# Patient Record
Sex: Female | Born: 1950
Health system: Southern US, Community
[De-identification: ages and names within clinical notes are randomized; demographics above are authoritative.]

## PROBLEM LIST (undated history)

## (undated) DIAGNOSIS — G43909 Migraine, unspecified, not intractable, without status migrainosus: Secondary | ICD-10-CM

## (undated) DIAGNOSIS — M23209 Derangement of unspecified meniscus due to old tear or injury, unspecified knee: Secondary | ICD-10-CM

## (undated) DIAGNOSIS — R51 Headache: Secondary | ICD-10-CM

## (undated) DIAGNOSIS — K219 Gastro-esophageal reflux disease without esophagitis: Secondary | ICD-10-CM

## (undated) DIAGNOSIS — J069 Acute upper respiratory infection, unspecified: Secondary | ICD-10-CM

## (undated) DIAGNOSIS — F4310 Post-traumatic stress disorder, unspecified: Secondary | ICD-10-CM

## (undated) HISTORY — DX: Post-traumatic stress disorder, unspecified: F43.10

---

## 2000-06-23 ENCOUNTER — Inpatient Hospital Stay (HOSPITAL_COMMUNITY): Admission: EM | Admit: 2000-06-23 | Discharge: 2000-06-26 | Payer: Self-pay | Admitting: Orthopedic Surgery

## 2000-06-24 ENCOUNTER — Encounter: Payer: Self-pay | Admitting: Orthopedic Surgery

## 2001-04-07 ENCOUNTER — Ambulatory Visit (HOSPITAL_BASED_OUTPATIENT_CLINIC_OR_DEPARTMENT_OTHER): Admission: RE | Admit: 2001-04-07 | Discharge: 2001-04-07 | Payer: Self-pay | Admitting: Orthopedic Surgery

## 2004-06-27 ENCOUNTER — Other Ambulatory Visit: Admission: RE | Admit: 2004-06-27 | Discharge: 2004-06-27 | Payer: Self-pay | Admitting: Obstetrics and Gynecology

## 2006-11-19 ENCOUNTER — Other Ambulatory Visit
Admission: RE | Admit: 2006-11-19 | Discharge: 2006-11-19 | Payer: Self-pay | Admitting: Physical Medicine & Rehabilitation

## 2006-11-24 ENCOUNTER — Encounter: Admission: RE | Admit: 2006-11-24 | Discharge: 2006-11-24 | Payer: Self-pay | Admitting: Obstetrics and Gynecology

## 2010-10-08 ENCOUNTER — Encounter: Admission: RE | Admit: 2010-10-08 | Discharge: 2010-10-08 | Payer: Self-pay | Admitting: Family Medicine

## 2011-05-08 NOTE — Op Note (Signed)
. [facility]  Patient:    Denise Patton, Denise Patton                          MRN: [id number] Proc. Date: 04/07/01 Adm. Date:  [id number] Attending:  Aldean Baker V                           Operative Report  PREOPERATIVE DIAGNOSIS:  Painful retained proximal interlocking screw, left tibial nail.  POSTOPERATIVE DIAGNOSIS:  Painful retained proximal interlocking screw, left tibial nail.  OPERATION PERFORMED:  Removal of deep retained hardware.  SURGEON:  Nadara Mustard, M.D.  ANESTHESIA:  LMA.  ESTIMATED BLOOD LOSS:  Minimal.  ANTIBIOTICS:  One gram of Kefzol.  TOURNIQUET TIME:  None.  PATHOLOGY:  Screw given to patient.  DISPOSITION:  To PACU in stable condition.  INDICATIONS FOR PROCEDURE:  The patient is a 60 year old woman who is status post a pilon fracture for which she underwent open reduction internal fixation and has healed her bony fracture and presents at this time for removal of proximal painful retained screw.  Risks and benefits were discussed including infection, neurovascular injury, persistent pain.  The patient states that she understands and wishes to proceed at this time.  DESCRIPTION OF PROCEDURE:  The patient was brought to outpatient room 5 and underwent a general LMA anesthetic.  After an adequate level of anesthesia was obtained.  The patients left lower extremity was prepped using DuraPrep and draped in the sterile field.  Her previous surgical incision was used.  Blunt dissection was carried down to the screw head.  Fibrous tissue was removed away from the screw head and the screw was extracted without difficulty.  The wound was irrigated with normal saline.  C-arm fluoroscopy was used for guidance for screw removal and verification of screw removal.  The wound was closed with a subcutaneous closure of 2-0 Vicryl.  The skin was closed using Steri-Strips.  The wound was covered with Adaptic orthopedic sponges, sterile Webril  and a loosely wrapped Coban.  The patient was extubated and taken to PACU in stable condition.  Plan to follow up in the office in two weeks. DD:  04/07/01 TD:  04/08/01 Job: [id number]

## 2012-08-12 ENCOUNTER — Encounter (HOSPITAL_COMMUNITY): Payer: Self-pay | Admitting: Pharmacy Technician

## 2012-08-15 ENCOUNTER — Other Ambulatory Visit: Payer: Self-pay | Admitting: Orthopedic Surgery

## 2012-08-15 MED ORDER — DEXAMETHASONE SODIUM PHOSPHATE 10 MG/ML IJ SOLN: 10.0000 mg | Freq: Once | INTRAMUSCULAR | Status: DC

## 2012-08-15 NOTE — Progress Notes (Signed)
Preoperative surgical orders have been place into the Epic hospital system for Denise Patton on 08/15/2012, 8:18 AM  by Patrica Duel for surgery on 08/24/2012.  Preop Knee Scope orders including IV Tylenol and IV Decadron as long as there are no contraindications to the above medications. Avel Peace, PA-C

## 2012-08-17 ENCOUNTER — Inpatient Hospital Stay (HOSPITAL_COMMUNITY): Admission: RE | Admit: 2012-08-17 | Payer: BC Managed Care – PPO | Source: Ambulatory Visit

## 2012-08-23 ENCOUNTER — Encounter (HOSPITAL_COMMUNITY)
Admission: RE | Admit: 2012-08-23 | Discharge: 2012-08-23 | Disposition: A | Discharge status: Home / Self Care | Payer: BC Managed Care – PPO | Source: Ambulatory Visit | Attending: Orthopedic Surgery | Admitting: Orthopedic Surgery

## 2012-08-23 ENCOUNTER — Encounter (HOSPITAL_COMMUNITY): Payer: Self-pay

## 2012-08-23 DIAGNOSIS — M23209 Derangement of unspecified meniscus due to old tear or injury, unspecified knee: Secondary | ICD-10-CM

## 2012-08-23 DIAGNOSIS — R51 Headache: Secondary | ICD-10-CM

## 2012-08-23 DIAGNOSIS — K219 Gastro-esophageal reflux disease without esophagitis: Secondary | ICD-10-CM

## 2012-08-23 HISTORY — DX: Derangement of unspecified meniscus due to old tear or injury, unspecified knee: M23.209

## 2012-08-23 HISTORY — PX: FRACTURE SURGERY: SHX138

## 2012-08-23 HISTORY — DX: Gastro-esophageal reflux disease without esophagitis: K21.9

## 2012-08-23 HISTORY — PX: BLEPHAROPLASTY: SUR158

## 2012-08-23 HISTORY — PX: CHOLECYSTECTOMY: SHX55

## 2012-08-23 HISTORY — PX: LASIK: SHX215

## 2012-08-23 HISTORY — DX: Headache: R51

## 2012-08-23 LAB — SURGICAL PCR SCREEN: MRSA, PCR: NEGATIVE

## 2012-08-23 MED ORDER — SODIUM CHLORIDE 0.9 % IV SOLN: INTRAVENOUS | Status: DC

## 2012-08-23 MED ORDER — DEXTROSE 5 % IV SOLN
3.0000 g | INTRAVENOUS | Status: DC
  Filled 2012-08-23: qty 3000

## 2012-08-23 NOTE — Patient Instructions (Addendum)
20 POET HINEMAN  08/23/2012   Your procedure is scheduled on: 9-4  -2013  Report to [facility] at  1000      AM .  Call this number if you have problems the morning of surgery: [phone] or Presurgical Testing prior to [phone]   Remember:   Do not eat food:After Midnight.  May have clear liquids:up to 6 Hours before arrival. Nothing after : 0600 AM  Clear liquids include soda, tea, black coffee, apple or grape juice, broth.  Take these medicines the morning of surgery with A SIP OF WATER: Zyrtec, Dexilant    Do not wear jewelry, make-up or nail polish.  Do not wear lotions, powders, or perfumes. You may wear deodorant.  Do not shave 48 hours prior to surgery.(face and neck okay, no shaving of legs)  Do not bring valuables to the hospital.  Contacts, dentures or bridgework may not be worn into surgery.  Leave suitcase in the car. After surgery it may be brought to your room.  For patients admitted to the hospital, checkout time is 11:00 AM the day of discharge.   Patients discharged the day of surgery will not be allowed to drive home.  Name and phone number of your driver: spouse , Molly Maduro AVWUJ,WJ-[phone]  Special Instructions: CHG Shower Use Special Wash: 1/2 bottle night before surgery and 1/2 bottle morning of surgery.(avoid face and genitals)   Please read over the following fact sheets that you were given: MRSA Information, Incentive Spirometry Instruction.

## 2012-08-24 ENCOUNTER — Encounter (HOSPITAL_COMMUNITY): Payer: Self-pay | Admitting: *Deleted

## 2012-08-24 ENCOUNTER — Ambulatory Visit (HOSPITAL_COMMUNITY)
Admission: RE | Admit: 2012-08-24 | Discharge: 2012-08-24 | Disposition: A | Discharge status: Home / Self Care | Payer: BC Managed Care – PPO | Source: Ambulatory Visit | Attending: Orthopedic Surgery | Admitting: Orthopedic Surgery

## 2012-08-24 ENCOUNTER — Encounter (HOSPITAL_COMMUNITY)
Admission: RE | Disposition: A | Discharge status: Home / Self Care | Payer: Self-pay | Source: Ambulatory Visit | Attending: Orthopedic Surgery

## 2012-08-24 ENCOUNTER — Ambulatory Visit (HOSPITAL_COMMUNITY): Payer: BC Managed Care – PPO | Admitting: *Deleted

## 2012-08-24 DIAGNOSIS — Z79899 Other long term (current) drug therapy: Secondary | ICD-10-CM | POA: Insufficient documentation

## 2012-08-24 DIAGNOSIS — Z01812 Encounter for preprocedural laboratory examination: Secondary | ICD-10-CM | POA: Insufficient documentation

## 2012-08-24 DIAGNOSIS — X500XXA Overexertion from strenuous movement or load, initial encounter: Secondary | ICD-10-CM | POA: Insufficient documentation

## 2012-08-24 DIAGNOSIS — S83289A Other tear of lateral meniscus, current injury, unspecified knee, initial encounter: Secondary | ICD-10-CM | POA: Insufficient documentation

## 2012-08-24 DIAGNOSIS — K219 Gastro-esophageal reflux disease without esophagitis: Secondary | ICD-10-CM | POA: Insufficient documentation

## 2012-08-24 HISTORY — PX: KNEE ARTHROSCOPY: SHX127

## 2012-08-24 SURGERY — ARTHROSCOPY, KNEE
Anesthesia: General | Site: Knee | Laterality: Left | Wound class: Clean

## 2012-08-24 MED ORDER — OXYCODONE-ACETAMINOPHEN 5-325 MG PO TABS: 1.0000 | ORAL_TABLET | ORAL | Status: AC | PRN

## 2012-08-24 MED ORDER — ONDANSETRON HCL 4 MG/2ML IJ SOLN
INTRAMUSCULAR | Status: DC | PRN
  Administered 2012-08-24: 4 mg via INTRAVENOUS

## 2012-08-24 MED ORDER — OXYCODONE-ACETAMINOPHEN 5-325 MG PO TABS
1.0000 | ORAL_TABLET | Freq: Once | ORAL | Status: AC
  Administered 2012-08-24: 1 via ORAL
  Filled 2012-08-24: qty 1

## 2012-08-24 MED ORDER — DEXAMETHASONE SODIUM PHOSPHATE 4 MG/ML IJ SOLN
INTRAMUSCULAR | Status: DC | PRN
  Administered 2012-08-24: 4 mg via INTRAVENOUS

## 2012-08-24 MED ORDER — LACTATED RINGERS IV SOLN
INTRAVENOUS | Status: DC
  Administered 2012-08-24: 13:00:00 via INTRAVENOUS
  Administered 2012-08-24: 1000 mL via INTRAVENOUS

## 2012-08-24 MED ORDER — METHOCARBAMOL 500 MG PO TABS: 500.0000 mg | ORAL_TABLET | Freq: Four times a day (QID) | ORAL | Status: AC

## 2012-08-24 MED ORDER — MIDAZOLAM HCL 5 MG/5ML IJ SOLN
INTRAMUSCULAR | Status: DC | PRN
  Administered 2012-08-24: 2 mg via INTRAVENOUS

## 2012-08-24 MED ORDER — BUPIVACAINE-EPINEPHRINE PF 0.25-1:200000 % IJ SOLN
INTRAMUSCULAR | Status: AC
  Filled 2012-08-24: qty 30

## 2012-08-24 MED ORDER — PROMETHAZINE HCL 25 MG/ML IJ SOLN: 6.2500 mg | INTRAMUSCULAR | Status: DC | PRN

## 2012-08-24 MED ORDER — CHLORHEXIDINE GLUCONATE 4 % EX LIQD: 60.0000 mL | Freq: Once | CUTANEOUS | Status: DC

## 2012-08-24 MED ORDER — METHOCARBAMOL 500 MG PO TABS
500.0000 mg | ORAL_TABLET | Freq: Once | ORAL | Status: AC
  Administered 2012-08-24: 500 mg via ORAL
  Filled 2012-08-24: qty 1

## 2012-08-24 MED ORDER — FENTANYL CITRATE 0.05 MG/ML IJ SOLN
INTRAMUSCULAR | Status: AC
  Filled 2012-08-24: qty 2

## 2012-08-24 MED ORDER — CEFAZOLIN SODIUM-DEXTROSE 2-3 GM-% IV SOLR
2.0000 g | INTRAVENOUS | Status: AC
  Administered 2012-08-24: 2 g via INTRAVENOUS

## 2012-08-24 MED ORDER — CEFAZOLIN SODIUM-DEXTROSE 2-3 GM-% IV SOLR
INTRAVENOUS | Status: AC
  Filled 2012-08-24: qty 50

## 2012-08-24 MED ORDER — ACETAMINOPHEN 10 MG/ML IV SOLN
1000.0000 mg | Freq: Once | INTRAVENOUS | Status: AC
  Administered 2012-08-24: 1000 mg via INTRAVENOUS

## 2012-08-24 MED ORDER — PROPOFOL 10 MG/ML IV BOLUS
INTRAVENOUS | Status: DC | PRN
  Administered 2012-08-24: 200 mg via INTRAVENOUS

## 2012-08-24 MED ORDER — FENTANYL CITRATE 0.05 MG/ML IJ SOLN
25.0000 ug | INTRAMUSCULAR | Status: DC | PRN
  Administered 2012-08-24 (×2): 25 ug via INTRAVENOUS

## 2012-08-24 MED ORDER — BUPIVACAINE-EPINEPHRINE 0.25% -1:200000 IJ SOLN
INTRAMUSCULAR | Status: DC | PRN
  Administered 2012-08-24: 20 mL

## 2012-08-24 MED ORDER — ACETAMINOPHEN 10 MG/ML IV SOLN
INTRAVENOUS | Status: AC
Start: 2012-08-24 — End: 2012-08-24
  Filled 2012-08-24: qty 100

## 2012-08-24 MED ORDER — FENTANYL CITRATE 0.05 MG/ML IJ SOLN
INTRAMUSCULAR | Status: DC | PRN
  Administered 2012-08-24 (×2): 50 ug via INTRAVENOUS
  Administered 2012-08-24: 25 ug via INTRAVENOUS
  Administered 2012-08-24: 100 ug via INTRAVENOUS
  Administered 2012-08-24: 50 ug via INTRAVENOUS
  Administered 2012-08-24: 25 ug via INTRAVENOUS

## 2012-08-24 MED ORDER — LIDOCAINE HCL 1 % IJ SOLN
INTRAMUSCULAR | Status: DC | PRN
  Administered 2012-08-24: 50 mg via INTRADERMAL

## 2012-08-24 MED ORDER — LACTATED RINGERS IR SOLN
Status: DC | PRN
  Administered 2012-08-24: 6000 mL

## 2012-08-24 MED ORDER — METOCLOPRAMIDE HCL 5 MG/ML IJ SOLN
INTRAMUSCULAR | Status: DC | PRN
  Administered 2012-08-24: 10 mg via INTRAVENOUS

## 2012-08-24 SURGICAL SUPPLY — 26 items
BANDAGE ELASTIC 6 VELCRO ST LF (GAUZE/BANDAGES/DRESSINGS) ×2 IMPLANT
BLADE 4.2CUDA (BLADE) ×2 IMPLANT
CLOTH BEACON ORANGE TIMEOUT ST (SAFETY) ×2 IMPLANT
CUFF TOURN SGL QUICK 34 (TOURNIQUET CUFF) ×1
CUFF TRNQT CYL 34X4X40X1 (TOURNIQUET CUFF) ×1 IMPLANT
DRAPE U-SHAPE 47X51 STRL (DRAPES) ×2 IMPLANT
DRSG EMULSION OIL 3X3 NADH (GAUZE/BANDAGES/DRESSINGS) ×2 IMPLANT
DRSG PAD ABDOMINAL 8X10 ST (GAUZE/BANDAGES/DRESSINGS) ×2 IMPLANT
DURAPREP 26ML APPLICATOR (WOUND CARE) ×2 IMPLANT
GLOVE BIO SURGEON STRL SZ7.5 (GLOVE) ×2 IMPLANT
GLOVE BIO SURGEON STRL SZ8 (GLOVE) ×2 IMPLANT
GLOVE BIOGEL PI IND STRL 8 (GLOVE) ×2 IMPLANT
GLOVE BIOGEL PI INDICATOR 8 (GLOVE) ×2
GOWN STRL NON-REIN LRG LVL3 (GOWN DISPOSABLE) ×2 IMPLANT
MANIFOLD NEPTUNE II (INSTRUMENTS) ×4 IMPLANT
PACK ARTHROSCOPY WL (CUSTOM PROCEDURE TRAY) ×2 IMPLANT
PACK ICE MAXI GEL EZY WRAP (MISCELLANEOUS) ×6 IMPLANT
PADDING CAST COTTON 6X4 STRL (CAST SUPPLIES) ×2 IMPLANT
POSITIONER SURGICAL ARM (MISCELLANEOUS) ×2 IMPLANT
SET ARTHROSCOPY TUBING (MISCELLANEOUS) ×1
SET ARTHROSCOPY TUBING LN (MISCELLANEOUS) ×1 IMPLANT
SPONGE GAUZE 4X4 12PLY (GAUZE/BANDAGES/DRESSINGS) ×2 IMPLANT
SUT ETHILON 4 0 PS 2 18 (SUTURE) ×2 IMPLANT
TOWEL OR 17X26 10 PK STRL BLUE (TOWEL DISPOSABLE) ×2 IMPLANT
WAND 90 DEG TURBOVAC W/CORD (SURGICAL WAND) ×2 IMPLANT
WRAP KNEE MAXI GEL POST OP (GAUZE/BANDAGES/DRESSINGS) ×2 IMPLANT

## 2012-08-24 NOTE — Interval H&P Note (Signed)
History and Physical Interval Note:  08/24/2012 12:33 PM  Denise Patton  has presented today for surgery, with the diagnosis of Left Knee Lateral Meniscal Tear  The various methods of treatment have been discussed with the patient and family. After consideration of risks, benefits and other options for treatment, the patient has consented to  Procedure(s) (LRB) with comments: ARTHROSCOPY KNEE (Left) as a surgical intervention .  The patient's history has been reviewed, patient examined, no change in status, stable for surgery.  I have reviewed the patient's chart and labs.  Questions were answered to the patient's satisfaction.     Loanne Drilling

## 2012-08-24 NOTE — Anesthesia Postprocedure Evaluation (Signed)
  Anesthesia Post-op Note  Patient: Denise Patton  Procedure(s) Performed: Procedure(s) (LRB): ARTHROSCOPY KNEE (Left)  Patient Location: PACU  Anesthesia Type: General  Level of Consciousness: awake and alert   Airway and Oxygen Therapy: Patient Spontanous Breathing  Post-op Pain: mild  Post-op Assessment: Post-op Vital signs reviewed, Patient's Cardiovascular Status Stable, Respiratory Function Stable, Patent Airway and No signs of Nausea or vomiting  Post-op Vital Signs: stable  Complications: No apparent anesthesia complications

## 2012-08-24 NOTE — Op Note (Signed)
Preoperative diagnosis-  Left knee lateral meniscal tear  Postoperative diagnosis Left- knee lateral meniscal tear   Procedure- Left knee arthroscopy with lateral  Meniscal debridement   Surgeon- Gus Rankin. Emmanuelle Hibbitts, MD  Anesthesia-General  EBL-  minimal Complications- None  Condition- PACU - hemodynamically stable.  Brief clinical note- -Denise Patton is a 61 y.o.  female with a several week history of left knee pain and mechanical symptoms. Exam and history suggested lateral meniscal tear confirmed by MRI. The patient presents now for arthroscopy and debridement   Procedure in detail -       After successful administration of General anesthetic, a tourmiquet is placed high on the Left  thigh and the Left lower extremity is prepped and draped in the usual sterile fashion. Time out is performed by the surgical team. Standard superomedial and inferolateral portal sites are marked and incisions made with an 11 blade. The inflow cannula is passed through the superomedial portal and camera through the inferolateral portal and inflow is initiated. Arthroscopic visualization proceeds.      The undersurface of the patella and trochlea are visualized and there is Grade II chondromalacia of both the patella and central trochlea. . The medial and lateral gutters are visualized and there are no loose bodies. Flexion and valgus force is applied to the knee and the medial compartment is entered. A spinal needle is passed into the joint through the site marked for the inferomedial portal. A small incision is made and the dilator passed into the joint. The findings for the medial compartment are mild chondromalacia medial femoral condyle without meniscal tear.     The intercondylar notch is visualized and the ACL appears normal. The lateral compartment is entered and the findings are tear of body and posterior horn lateral meniscus with bucket handle fragment. There is also chondromalacia lateral tibial plateau  and a small focal grade 4 area adjacent to the intercondylar eminence laterally . The tear is debrided to a stable base with baskets and a shaver and sealed off with the Arthrocare. The shaver is used to debride the unstable cartilage to a stable cartilaginous base with stable edges. It is probed and found to be stable.     The joint is again inspected and there are no other tears, defects or loose bodies identified. The arthroscopic equipment is then removed from the inferior portals which are closed with interrupted 4-0 nylon. 20 ml of .25% Marcaine with epinephrine are injected through the inflow cannula and the cannula is then removed and the portal closed with nylon. The incisions are cleaned and dried and a bulky sterile dressing is applied. The patient is then awakened and transported to recovery in stable condition.   08/24/2012, 1:27 PM

## 2012-08-24 NOTE — Anesthesia Preprocedure Evaluation (Signed)
Anesthesia Evaluation  Patient identified by MRN, date of birth, ID band Patient awake    Reviewed: Allergy & Precautions, H&P , NPO status , Patient's Chart, lab work & pertinent test results  Airway Mallampati: II TM Distance: >3 FB Neck ROM: Full    Dental No notable dental hx.    Pulmonary neg pulmonary ROS,  breath sounds clear to auscultation  Pulmonary exam normal       Cardiovascular negative cardio ROS  Rhythm:Regular Rate:Normal     Neuro/Psych negative neurological ROS  negative psych ROS   GI/Hepatic negative GI ROS, Neg liver ROS,   Endo/Other  negative endocrine ROS  Renal/GU negative Renal ROS  negative genitourinary   Musculoskeletal negative musculoskeletal ROS (+)   Abdominal (+) + obese,   Peds negative pediatric ROS (+)  Hematology negative hematology ROS (+)   Anesthesia Other Findings   Reproductive/Obstetrics negative OB ROS                           Anesthesia Physical Anesthesia Plan  ASA: II  Anesthesia Plan: General   Post-op Pain Management:    Induction: Intravenous  Airway Management Planned: LMA  Additional Equipment:   Intra-op Plan:   Post-operative Plan: Extubation in OR  Informed Consent: I have reviewed the patients History and Physical, chart, labs and discussed the procedure including the risks, benefits and alternatives for the proposed anesthesia with the patient or authorized representative who has indicated his/her understanding and acceptance.   Dental advisory given  Plan Discussed with: CRNA  Anesthesia Plan Comments:         Anesthesia Quick Evaluation

## 2012-08-24 NOTE — H&P (Signed)
  CC- Denise Patton is a 61 y.o. female who presents with left knee pain.  HPI- . Knee Pain: Patient presents with a knee injury involving the  left knee. Onset of the symptoms was several weeks ago. Inciting event:twisted knee Current symptoms include giving out, pain located medial and lateral, stiffness and swelling. Pain is aggravated by pivoting, rising after sitting, squatting and walking.  Patient has had no prior knee problems. Evaluation to date: MRI: abnormal leteral meniscal tear and MCL sprain. Treatment to date: brace which is not very effective.  Past Medical History  Diagnosis Date  . GERD (gastroesophageal reflux disease) 08-23-12    reflux controlled with Dexilant  . Headache 08-23-12    tx. migraines -with Botox inj. x3 months  . Chronic meniscal tear of knee 08-23-12    left knee meniscal tear    Past Surgical History  Procedure Date  . Cholecystectomy 08-23-12    lap. Cholecystectomy(stones)  . Fracture surgery 08-23-12    '00-ORIF left leg fracture-retained rod  . Lasik 08-23-12    bilateral  . Blepharoplasty 08-23-12    bilateral    Prior to Admission medications   Medication Sig Start Date End Date Taking? Authorizing Provider  cetirizine (ZYRTEC) 10 MG tablet Take 10 mg by mouth daily.    Historical Provider, MD  dexlansoprazole (DEXILANT) 60 MG capsule Take 60 mg by mouth daily with breakfast.    Historical Provider, MD   KNEE EXAM antalgic gait, soft tissue tenderness over medial and lateral joint line, effusion, reduced range of motion, negative drawer sign, collateral ligaments intact, normal ipsilateral hip exam  Physical Examination: General appearance - alert, well appearing, and in no distress Mental status - alert, oriented to person, place, and time Chest - clear to auscultation, no wheezes, rales or rhonchi, symmetric air entry Heart - normal rate, regular rhythm, normal S1, S2, no murmurs, rubs, clicks or gallops Abdomen - soft, nontender, nondistended, no  masses or organomegaly Neurological - alert, oriented, normal speech, no focal findings or movement disorder noted   Asessment/Plan--- Left knee lateral meniscal tear- - Plan left knee arthroscopy with lateral debridement. Procedure risks and potential comps discussed with patient who elects to proceed. Goals are decreased pain and increased function with a high likelihood of achieving both

## 2012-08-24 NOTE — Transfer of Care (Signed)
Immediate Anesthesia Transfer of Care Note  Patient: Denise Patton  Procedure(s) Performed: Procedure(s) (LRB) with comments: ARTHROSCOPY KNEE (Left) - lateral meniscal debridement  Patient Location: PACU  Anesthesia Type: General  Level of Consciousness: awake, alert  and oriented  Airway & Oxygen Therapy: Patient Spontanous Breathing and Patient connected to face mask oxygen  Post-op Assessment: Report given to PACU RN, Post -op Vital signs reviewed and stable and Patient moving all extremities  Post vital signs: Reviewed and stable  Complications: No apparent anesthesia complications

## 2012-08-24 NOTE — Progress Notes (Signed)
Denise Patton, ortho tech came by and instructions given to pt regarding crutch teaching.

## 2012-08-25 ENCOUNTER — Encounter (HOSPITAL_COMMUNITY): Payer: Self-pay | Admitting: Orthopedic Surgery

## 2013-01-23 ENCOUNTER — Other Ambulatory Visit: Payer: Self-pay | Admitting: Orthopedic Surgery

## 2013-01-23 MED ORDER — BUPIVACAINE LIPOSOME 1.3 % IJ SUSP: 20.0000 mL | Freq: Once | INTRAMUSCULAR | Status: DC

## 2013-01-23 MED ORDER — DEXAMETHASONE SODIUM PHOSPHATE 10 MG/ML IJ SOLN: 10.0000 mg | Freq: Once | INTRAMUSCULAR | Status: DC

## 2013-01-23 NOTE — Progress Notes (Signed)
Preoperative surgical orders have been place into the Epic hospital system for Denise Patton on 01/23/2013, 7:23 AM  by Patrica Duel for surgery on 02/13/2013.  Preop Total Knee orders including Experal, IV Tylenol, and IV Decadron as long as there are no contraindications to the above medications. Avel Peace, PA-C

## 2013-02-01 ENCOUNTER — Encounter (HOSPITAL_COMMUNITY): Payer: Self-pay | Admitting: Pharmacy Technician

## 2013-02-07 ENCOUNTER — Encounter (HOSPITAL_COMMUNITY)
Admission: RE | Admit: 2013-02-07 | Discharge: 2013-02-07 | Disposition: A | Discharge status: Home / Self Care | Payer: BC Managed Care – PPO | Source: Ambulatory Visit | Attending: Orthopedic Surgery | Admitting: Orthopedic Surgery

## 2013-02-07 ENCOUNTER — Encounter (HOSPITAL_COMMUNITY): Payer: Self-pay

## 2013-02-07 DIAGNOSIS — Z01812 Encounter for preprocedural laboratory examination: Secondary | ICD-10-CM | POA: Insufficient documentation

## 2013-02-07 HISTORY — DX: Acute upper respiratory infection, unspecified: J06.9

## 2013-02-07 LAB — PROTIME-INR
INR: 0.91 (ref 0.00–1.49)
Prothrombin Time: 12.2 seconds (ref 11.6–15.2)

## 2013-02-07 LAB — URINALYSIS, ROUTINE W REFLEX MICROSCOPIC
Bilirubin Urine: NEGATIVE
Glucose, UA: NEGATIVE mg/dL
Hgb urine dipstick: NEGATIVE
Ketones, ur: NEGATIVE mg/dL
Specific Gravity, Urine: 1.02 (ref 1.005–1.030)
pH: 6 (ref 5.0–8.0)

## 2013-02-07 LAB — COMPREHENSIVE METABOLIC PANEL
ALT: 17 U/L (ref 0–35)
AST: 14 U/L (ref 0–37)
Albumin: 3.5 g/dL (ref 3.5–5.2)
Alkaline Phosphatase: 104 U/L (ref 39–117)
BUN: 22 mg/dL (ref 6–23)
CO2: 28 mEq/L (ref 19–32)
Calcium: 9.3 mg/dL (ref 8.4–10.5)
Chloride: 101 mEq/L (ref 96–112)
Creatinine, Ser: 0.82 mg/dL (ref 0.50–1.10)
GFR calc Af Amer: 89 mL/min — ABNORMAL LOW (ref >90)
GFR calc non Af Amer: 77 mL/min — ABNORMAL LOW (ref >90)
Glucose, Bld: 111 mg/dL — ABNORMAL HIGH (ref 70–99)
Potassium: 4.6 mEq/L (ref 3.5–5.1)
Sodium: 137 mEq/L (ref 135–145)
Total Bilirubin: 0.2 mg/dL — ABNORMAL LOW (ref 0.3–1.2)
Total Protein: 7.8 g/dL (ref 6.0–8.3)

## 2013-02-07 LAB — CBC
Hemoglobin: 14.6 g/dL (ref 12.0–15.0)
MCH: 33.4 pg (ref 26.0–34.0)
MCHC: 33 g/dL (ref 30.0–36.0)
RDW: 12.5 % (ref 11.5–15.5)

## 2013-02-07 LAB — SURGICAL PCR SCREEN: Staphylococcus aureus: NEGATIVE

## 2013-02-07 NOTE — Patient Instructions (Addendum)
20 MAHEEN CWIKLA  02/07/2013   Your procedure is scheduled on:  02/13/13  MONDAY  Report to [facility] at     12:05PM  Call this number if you have problems the morning of surgery: [phone]       Remember:   Do not eat food After Midnight. Sunday NIGHT--- MAY HAVE CLEAR LIQUIDS UNTIL 0900AM Monday MORNING THEN NOTHING BY MOUTH   Take these medicines the morning of surgery with A SIP OF WATER: Raelyn Mora, ZYRTEC    May take Percocet if needed   .  Contacts, dentures or partial plates can not be worn to surgery  Leave suitcase in the car. After surgery it may be brought to your room.  For patients admitted to the hospital, checkout time is 11:00 AM day of  discharge.             SPECIAL INSTRUCTIONS- SEE [place] PREPARING FOR SURGERY INSTRUCTION SHEET-     DO NOT WEAR JEWELRY, LOTIONS, POWDERS, OR PERFUMES.  WOMEN-- DO NOT SHAVE LEGS OR UNDERARMS FOR 12 HOURS BEFORE SHOWERS. MEN MAY SHAVE FACE.  Patients discharged the day of surgery will not be allowed to drive home. IF going home the day of surgery, you must have a driver and someone to stay with you for the first 24 hours  Name and phone number of your driver:     admission                                                                   Please read over the following fact sheets that you were given: MRSA Information, Incentive Spirometry Sheet, Blood Transfusion Sheet  Information                                                                                   Larraine Argo  PST 336  [phone]                 FAILURE TO FOLLOW THESE INSTRUCTIONS MAY RESULT IN  CANCELLATION   OF YOUR SURGERY                                                  Patient Signature _____________________________

## 2013-02-07 NOTE — Progress Notes (Signed)
Clearance Dr Tiburcio Pea on chart.  Spoke with Gareth Eagle PA who stated to discontinue bilateral hip x ray order in [facility]

## 2013-02-12 ENCOUNTER — Other Ambulatory Visit: Payer: Self-pay | Admitting: Orthopedic Surgery

## 2013-02-12 NOTE — H&P (Signed)
Denise Patton  DOB: 05/24/1951 Married / Language: English / Race: White Female  Date of Admission:  02/13/2013  Chief Complaint:  Left Knee Pain  History of Present Illness The patient is a 62 year old female who comes in for a preoperative History and Physical. The patient is scheduled for a left total knee arthroplasty (with hardware removal) to be performed by Dr. Gus Rankin. Aluisio, MD at [facility] on 02/13/2013. The patient is a 62 year old female who presents for follow up of their knee. The patient is being followed for their left knee pain and osteoarthritis. The patient feels that they are doing poorly and report their pain level to be severe and 6-8 / 10. The following medication has been used for pain control: Oxycodone (5mg ). The patient indicates that they have questions or concerns regarding pain, activity and their progress at this point. The right knee is now hurting as bad as the left. Unfortunately the Synvisc did not help the left. She said she is unable to do activities she desires. She has been putting on weight because she can no longer exercise. The knees are even bothering her at night. She is at a point where she would like to go ahead and get the knee replaced. They have been treated conservatively in the past for the above stated problem and despite conservative measures, they continue to have progressive pain and severe functional limitations and dysfunction. They have failed non-operative management including home exercise, medications. It is felt that they would benefit from undergoing total joint replacement. Risks and benefits of the procedure have been discussed with the patient and they elect to proceed with surgery. There are no active contraindications to surgery such as ongoing infection or rapidly progressive neurological disease.   Problem List Primary osteoarthritis of one knee (715.16)   Allergies Codeine/Codeine Derivatives. does  not get much relief from it even in high doses   Family History Heart Disease. grandfather mothers side grandmother fathers side Diabetes Mellitus. father Osteoarthritis. grandmother mothers side Hypertension. mother Cerebrovascular Accident. grandmother fathers side Cancer. grandmother fathers side   Social History Marital status. married Living situation. live with spouse Pain Contract. no Number of flights of stairs before winded. 2-3 Drug/Alcohol Rehab (Previously). no Drug/Alcohol Rehab (Currently). no Illicit drug use. no Exercise. Exercises weekly; does other Tobacco use. never smoker Current work status. retired unemployed Children. 3 Alcohol use. current drinker; drinks wine; 5-7 per week current drinker; drinks wine; less than 5 per week Post-Surgical Plans. Plan for home.   Medication History ZyrTEC Allergy (10MG  Capsule, Oral) Active. Dexilant (30MG  Capsule DR, Oral) Active. Aleve ( Oral) Specific dose unknown - Active. Levsin/SL (0.125MG  Tab Sublingual, Sublingual) Active.   Past Surgical History Gallbladder Surgery. laporoscopic Arthroscopic Knee Surgery - Left Left Tibial Nailing   Medical History Gastroesophageal Reflux Disease Chronic Cystitis Migraine Headache Irritable bowel syndrome   Review of Systems General:Not Present- Chills, Fever, Night Sweats, Fatigue, Weight Gain, Weight Loss and Memory Loss. Skin:Not Present- Hives, Itching, Rash, Eczema and Lesions. HEENT:Not Present- Tinnitus, Headache, Double Vision, Visual Loss, Hearing Loss and Dentures. Respiratory:Not Present- Shortness of breath with exertion, Shortness of breath at rest, Allergies, Coughing up blood and Chronic Cough. Note:Recent cold that has resolved. Cardiovascular:Not Present- Chest Pain, Racing/skipping heartbeats, Difficulty Breathing Lying Down, Murmur, Swelling and Palpitations. Gastrointestinal:Not Present- Bloody Stool,  Heartburn, Abdominal Pain, Vomiting, Nausea, Constipation, Diarrhea, Difficulty Swallowing, Jaundice and Loss of appetitie. Female Genitourinary:Not Present- Blood in Urine, Urinary frequency,  Weak urinary stream, Discharge, Flank Pain, Incontinence, Painful Urination, Urgency, Urinary Retention and Urinating at Night. Musculoskeletal:Not Present- Muscle Weakness, Muscle Pain, Joint Swelling, Joint Pain, Back Pain, Morning Stiffness and Spasms. Neurological:Not Present- Tremor, Dizziness, Blackout spells, Paralysis, Difficulty with balance and Weakness. Psychiatric:Not Present- Insomnia.   Vitals Pulse: 96 (Regular) Resp.: 20 (Unlabored) BP: 154/82 (Sitting, Right Arm, Standard)    Physical Exam The physical exam findings are as follows:  Note: Patient is a 62 year old female with continued knee pain.   General Mental Status - Alert, cooperative and good historian. General Appearance- pleasant. Not in acute distress. Orientation- Oriented X3. Build & Nutrition- Well nourished and Well developed.   Head and Neck Head- normocephalic, atraumatic . Neck Global Assessment- supple. no bruit auscultated on the right and no bruit auscultated on the left.   Eye Vision- Wears corrective lenses. Sclera/Conjunctiva- Bilateral- Note: Injected appearance bilateral Pupil- Bilateral- Regular and Round. Motion- Bilateral- EOMI.   Chest and Lung Exam Auscultation: Breath sounds:- clear at anterior chest wall and - clear at posterior chest wall. Adventitious sounds:- No Adventitious sounds.   Cardiovascular Auscultation:Rhythm- Regular rate and rhythm. Heart Sounds- S1 WNL and S2 WNL. Murmurs & Other Heart Sounds:Auscultation of the heart reveals - No Murmurs.   Abdomen Inspection:Contour- Generalized moderate distention. Palpation/Percussion:Tenderness- Abdomen is non-tender to palpation. Rigidity (guarding)- Abdomen is  soft. Auscultation:Auscultation of the abdomen reveals - Bowel sounds normal.   Female Genitourinary Not done, not pertinent to present illness  Musculoskeletal Well developed female alert and oriented in no apparent distress. Both knees show no effusion. Her left knee range of motion is about 5 to 125. Moderate crepitus on range of motion. Tender lateral and medial with no instability noted. Right knee no effusion. Range 0 to 125. Slight crepitus on range of motion, slight tenderness medial greater than lateral with no instability.  RADIOGRAPHS: AP both knees and lateral and right knee shows minimal joint space narrowing. It is unchanged from previous films. The left knee unfortunately has significant collapse in the lateral compartment to where she is almost bone on bone. This is a big difference from her initial x-rays. Tibial nail does not have any interlocks which will come into play with regards to knee replacement.  Assessment & Plan Primary osteoarthritis of one knee (715.16) Impression: Left Knee  Note: Plan is for a Left Total Knee Replacement and Hardware Removal of Tibial Nail by Dr. Lequita Halt.  Plan is to go home.  PCP - Dr. Tiburcio Pea - Patient has been seen preoperatively and felt to be stable for surgery.  Signed electronically by Roberts Gaudy, PA-C

## 2013-02-13 ENCOUNTER — Ambulatory Visit (HOSPITAL_COMMUNITY): Payer: BC Managed Care – PPO | Admitting: Certified Registered"

## 2013-02-13 ENCOUNTER — Inpatient Hospital Stay (HOSPITAL_COMMUNITY)
Admission: RE | Admit: 2013-02-13 | Discharge: 2013-02-15 | DRG: 209 | Disposition: A | Discharge status: Home Health | Payer: BC Managed Care – PPO | Source: Ambulatory Visit | Attending: Orthopedic Surgery | Admitting: Orthopedic Surgery

## 2013-02-13 ENCOUNTER — Encounter (HOSPITAL_COMMUNITY): Payer: Self-pay | Admitting: Certified Registered"

## 2013-02-13 ENCOUNTER — Encounter (HOSPITAL_COMMUNITY)
Admission: RE | Disposition: A | Discharge status: Home Health | Payer: Self-pay | Source: Ambulatory Visit | Attending: Orthopedic Surgery

## 2013-02-13 ENCOUNTER — Encounter (HOSPITAL_COMMUNITY): Payer: Self-pay | Admitting: *Deleted

## 2013-02-13 DIAGNOSIS — Z472 Encounter for removal of internal fixation device: Secondary | ICD-10-CM

## 2013-02-13 DIAGNOSIS — M1710 Unilateral primary osteoarthritis, unspecified knee: Principal | ICD-10-CM | POA: Diagnosis present

## 2013-02-13 DIAGNOSIS — Z79899 Other long term (current) drug therapy: Secondary | ICD-10-CM

## 2013-02-13 DIAGNOSIS — Z96652 Presence of left artificial knee joint: Secondary | ICD-10-CM

## 2013-02-13 DIAGNOSIS — N3020 Other chronic cystitis without hematuria: Secondary | ICD-10-CM | POA: Diagnosis present

## 2013-02-13 DIAGNOSIS — K219 Gastro-esophageal reflux disease without esophagitis: Secondary | ICD-10-CM | POA: Diagnosis present

## 2013-02-13 DIAGNOSIS — Z6841 Body Mass Index (BMI) 40.0 and over, adult: Secondary | ICD-10-CM

## 2013-02-13 HISTORY — PX: TOTAL KNEE ARTHROPLASTY: SHX125

## 2013-02-13 HISTORY — PX: HARDWARE REMOVAL: SHX979

## 2013-02-13 LAB — TYPE AND SCREEN
ABO/RH(D): O POS
Antibody Screen: NEGATIVE

## 2013-02-13 LAB — ABO/RH: ABO/RH(D): O POS

## 2013-02-13 SURGERY — ARTHROPLASTY, KNEE, TOTAL
Anesthesia: Spinal | Site: Leg Lower | Laterality: Left | Wound class: Clean

## 2013-02-13 MED ORDER — ACETAMINOPHEN 650 MG RE SUPP: 650.0000 mg | Freq: Four times a day (QID) | RECTAL | Status: DC | PRN

## 2013-02-13 MED ORDER — BUPIVACAINE LIPOSOME 1.3 % IJ SUSP
20.0000 mL | Freq: Once | INTRAMUSCULAR | Status: AC
  Administered 2013-02-13: 20 mL
  Filled 2013-02-13: qty 20

## 2013-02-13 MED ORDER — DEXAMETHASONE 6 MG PO TABS
10.0000 mg | ORAL_TABLET | Freq: Once | ORAL | Status: AC
  Administered 2013-02-14: 10 mg via ORAL
  Filled 2013-02-13 (×2): qty 1

## 2013-02-13 MED ORDER — ACETAMINOPHEN 10 MG/ML IV SOLN
1000.0000 mg | Freq: Four times a day (QID) | INTRAVENOUS | Status: AC
  Administered 2013-02-13 – 2013-02-14 (×4): 1000 mg via INTRAVENOUS
  Filled 2013-02-13 (×6): qty 100

## 2013-02-13 MED ORDER — FENTANYL CITRATE 0.05 MG/ML IJ SOLN: 25.0000 ug | INTRAMUSCULAR | Status: DC | PRN

## 2013-02-13 MED ORDER — ONDANSETRON HCL 4 MG PO TABS: 4.0000 mg | ORAL_TABLET | Freq: Four times a day (QID) | ORAL | Status: DC | PRN

## 2013-02-13 MED ORDER — MIDAZOLAM HCL 5 MG/5ML IJ SOLN
INTRAMUSCULAR | Status: DC | PRN
  Administered 2013-02-13 (×2): 1 mg via INTRAVENOUS
  Administered 2013-02-13: 2 mg via INTRAVENOUS

## 2013-02-13 MED ORDER — 0.9 % SODIUM CHLORIDE (POUR BTL) OPTIME
TOPICAL | Status: DC | PRN
  Administered 2013-02-13: 1000 mL

## 2013-02-13 MED ORDER — MORPHINE SULFATE 10 MG/ML IJ SOLN
INTRAMUSCULAR | Status: AC
  Filled 2013-02-13: qty 1

## 2013-02-13 MED ORDER — BUPIVACAINE ON-Q PAIN PUMP (FOR ORDER SET NO CHG)
INJECTION | Status: DC
  Filled 2013-02-13: qty 1

## 2013-02-13 MED ORDER — PANTOPRAZOLE SODIUM 40 MG PO TBEC
80.0000 mg | DELAYED_RELEASE_TABLET | Freq: Every day | ORAL | Status: DC
  Filled 2013-02-13: qty 2

## 2013-02-13 MED ORDER — MENTHOL 3 MG MT LOZG
1.0000 | LOZENGE | OROMUCOSAL | Status: DC | PRN
  Filled 2013-02-13: qty 9

## 2013-02-13 MED ORDER — LACTATED RINGERS IV SOLN
INTRAVENOUS | Status: DC
  Administered 2013-02-13 (×2): via INTRAVENOUS
  Administered 2013-02-13: 1000 mL via INTRAVENOUS

## 2013-02-13 MED ORDER — TRAMADOL HCL 50 MG PO TABS: 50.0000 mg | ORAL_TABLET | Freq: Four times a day (QID) | ORAL | Status: DC | PRN

## 2013-02-13 MED ORDER — DIPHENHYDRAMINE HCL 12.5 MG/5ML PO ELIX
12.5000 mg | ORAL_SOLUTION | ORAL | Status: DC | PRN
  Administered 2013-02-13: 12.5 mg via ORAL
  Filled 2013-02-13: qty 5

## 2013-02-13 MED ORDER — SODIUM CHLORIDE 0.9 % IV SOLN: INTRAVENOUS | Status: DC

## 2013-02-13 MED ORDER — HYDROMORPHONE HCL PF 1 MG/ML IJ SOLN
0.5000 mg | INTRAMUSCULAR | Status: DC | PRN
  Administered 2013-02-13 (×2): 0.5 mg via INTRAVENOUS

## 2013-02-13 MED ORDER — FENTANYL CITRATE 0.05 MG/ML IJ SOLN
INTRAMUSCULAR | Status: DC | PRN
  Administered 2013-02-13 (×2): 50 ug via INTRAVENOUS

## 2013-02-13 MED ORDER — PHENOL 1.4 % MT LIQD: 1.0000 | OROMUCOSAL | Status: DC | PRN

## 2013-02-13 MED ORDER — CEFAZOLIN SODIUM-DEXTROSE 2-3 GM-% IV SOLR
INTRAVENOUS | Status: AC
  Filled 2013-02-13: qty 50

## 2013-02-13 MED ORDER — ACETAMINOPHEN 10 MG/ML IV SOLN
INTRAVENOUS | Status: AC
  Filled 2013-02-13: qty 100

## 2013-02-13 MED ORDER — OXYCODONE HCL 5 MG PO TABS
5.0000 mg | ORAL_TABLET | ORAL | Status: DC | PRN
  Administered 2013-02-13: 5 mg via ORAL
  Administered 2013-02-13 – 2013-02-14 (×2): 10 mg via ORAL
  Administered 2013-02-14 (×2): 5 mg via ORAL
  Administered 2013-02-14: 10 mg via ORAL
  Administered 2013-02-14: 5 mg via ORAL
  Administered 2013-02-14: 10 mg via ORAL
  Filled 2013-02-13 (×7): qty 2

## 2013-02-13 MED ORDER — BUPIVACAINE LIPOSOME 1.3 % IJ SUSP
Freq: Once | INTRAMUSCULAR | Status: DC
  Filled 2013-02-13: qty 20

## 2013-02-13 MED ORDER — DOCUSATE SODIUM 100 MG PO CAPS
100.0000 mg | ORAL_CAPSULE | Freq: Two times a day (BID) | ORAL | Status: DC
  Administered 2013-02-13 – 2013-02-15 (×4): 100 mg via ORAL

## 2013-02-13 MED ORDER — POLYETHYLENE GLYCOL 3350 17 G PO PACK: 17.0000 g | PACK | Freq: Every day | ORAL | Status: DC | PRN

## 2013-02-13 MED ORDER — BISACODYL 10 MG RE SUPP: 10.0000 mg | Freq: Every day | RECTAL | Status: DC | PRN

## 2013-02-13 MED ORDER — RIVAROXABAN 10 MG PO TABS
10.0000 mg | ORAL_TABLET | Freq: Every day | ORAL | Status: DC
  Administered 2013-02-14 – 2013-02-15 (×2): 10 mg via ORAL
  Filled 2013-02-13 (×3): qty 1

## 2013-02-13 MED ORDER — OXYCODONE HCL 5 MG PO TABS
ORAL_TABLET | ORAL | Status: AC
  Filled 2013-02-13: qty 1

## 2013-02-13 MED ORDER — METHOCARBAMOL 500 MG PO TABS
ORAL_TABLET | ORAL | Status: AC
  Filled 2013-02-13: qty 1

## 2013-02-13 MED ORDER — PROMETHAZINE HCL 25 MG/ML IJ SOLN: 6.2500 mg | INTRAMUSCULAR | Status: DC | PRN

## 2013-02-13 MED ORDER — HYOSCYAMINE SULFATE 0.125 MG PO TABS
0.1250 mg | ORAL_TABLET | Freq: Four times a day (QID) | ORAL | Status: DC | PRN
  Filled 2013-02-13: qty 1

## 2013-02-13 MED ORDER — ACETAMINOPHEN 10 MG/ML IV SOLN
1000.0000 mg | Freq: Once | INTRAVENOUS | Status: AC
  Administered 2013-02-13: 1000 mg via INTRAVENOUS

## 2013-02-13 MED ORDER — BUPIVACAINE IN DEXTROSE 0.75-8.25 % IT SOLN
INTRATHECAL | Status: DC | PRN
  Administered 2013-02-13: 1.8 mL via INTRATHECAL

## 2013-02-13 MED ORDER — ONDANSETRON HCL 4 MG/2ML IJ SOLN
INTRAMUSCULAR | Status: DC | PRN
  Administered 2013-02-13: 4 mg via INTRAVENOUS

## 2013-02-13 MED ORDER — CEFAZOLIN SODIUM-DEXTROSE 2-3 GM-% IV SOLR
2.0000 g | INTRAVENOUS | Status: AC
  Administered 2013-02-13: 2 g via INTRAVENOUS

## 2013-02-13 MED ORDER — MORPHINE SULFATE 2 MG/ML IJ SOLN
1.0000 mg | INTRAMUSCULAR | Status: DC | PRN
  Administered 2013-02-13 – 2013-02-14 (×7): 2 mg via INTRAVENOUS
  Filled 2013-02-13 (×6): qty 1

## 2013-02-13 MED ORDER — ACETAMINOPHEN 325 MG PO TABS: 650.0000 mg | ORAL_TABLET | Freq: Four times a day (QID) | ORAL | Status: DC | PRN

## 2013-02-13 MED ORDER — SODIUM CHLORIDE 0.9 % IR SOLN
Status: DC | PRN
  Administered 2013-02-13: 1000 mL

## 2013-02-13 MED ORDER — ONDANSETRON HCL 4 MG/2ML IJ SOLN: 4.0000 mg | Freq: Four times a day (QID) | INTRAMUSCULAR | Status: DC | PRN

## 2013-02-13 MED ORDER — DEXTROSE 5 % IV SOLN
500.0000 mg | Freq: Four times a day (QID) | INTRAVENOUS | Status: DC | PRN
  Filled 2013-02-13: qty 5

## 2013-02-13 MED ORDER — CEFAZOLIN SODIUM-DEXTROSE 2-3 GM-% IV SOLR
2.0000 g | Freq: Four times a day (QID) | INTRAVENOUS | Status: AC
  Administered 2013-02-13 – 2013-02-14 (×2): 2 g via INTRAVENOUS
  Filled 2013-02-13 (×2): qty 50

## 2013-02-13 MED ORDER — HYDROMORPHONE HCL PF 1 MG/ML IJ SOLN
INTRAMUSCULAR | Status: AC
  Filled 2013-02-13: qty 1

## 2013-02-13 MED ORDER — METOCLOPRAMIDE HCL 10 MG PO TABS: 5.0000 mg | ORAL_TABLET | Freq: Three times a day (TID) | ORAL | Status: DC | PRN

## 2013-02-13 MED ORDER — METOCLOPRAMIDE HCL 5 MG/ML IJ SOLN: 5.0000 mg | Freq: Three times a day (TID) | INTRAMUSCULAR | Status: DC | PRN

## 2013-02-13 MED ORDER — HYDROMORPHONE HCL PF 1 MG/ML IJ SOLN
INTRAMUSCULAR | Status: AC
  Filled 2013-02-13: qty 2

## 2013-02-13 MED ORDER — SODIUM CHLORIDE 0.9 % IV SOLN
INTRAVENOUS | Status: DC
  Administered 2013-02-13 – 2013-02-14 (×2): via INTRAVENOUS

## 2013-02-13 MED ORDER — MORPHINE SULFATE 2 MG/ML IJ SOLN
INTRAMUSCULAR | Status: AC
  Filled 2013-02-13: qty 1

## 2013-02-13 MED ORDER — HYDROMORPHONE HCL PF 1 MG/ML IJ SOLN
0.2500 mg | INTRAMUSCULAR | Status: DC | PRN
  Administered 2013-02-13 (×4): 0.5 mg via INTRAVENOUS

## 2013-02-13 MED ORDER — DEXAMETHASONE SODIUM PHOSPHATE 10 MG/ML IJ SOLN: 10.0000 mg | Freq: Once | INTRAMUSCULAR | Status: AC

## 2013-02-13 MED ORDER — METHOCARBAMOL 500 MG PO TABS
500.0000 mg | ORAL_TABLET | Freq: Four times a day (QID) | ORAL | Status: DC | PRN
  Administered 2013-02-13 – 2013-02-14 (×3): 500 mg via ORAL
  Filled 2013-02-13 (×2): qty 1

## 2013-02-13 MED ORDER — LORATADINE 10 MG PO TABS
10.0000 mg | ORAL_TABLET | Freq: Every day | ORAL | Status: DC
  Administered 2013-02-14 – 2013-02-15 (×2): 10 mg via ORAL
  Filled 2013-02-13 (×2): qty 1

## 2013-02-13 MED ORDER — DEXAMETHASONE SODIUM PHOSPHATE 10 MG/ML IJ SOLN
INTRAMUSCULAR | Status: DC | PRN
  Administered 2013-02-13: 10 mg via INTRAVENOUS

## 2013-02-13 MED ORDER — FLEET ENEMA 7-19 GM/118ML RE ENEM: 1.0000 | ENEMA | Freq: Once | RECTAL | Status: AC | PRN

## 2013-02-13 MED ORDER — SODIUM CHLORIDE 0.9 % IJ SOLN
INTRAMUSCULAR | Status: DC | PRN
  Administered 2013-02-13: 50 mL

## 2013-02-13 MED ORDER — PROPOFOL 10 MG/ML IV EMUL
INTRAVENOUS | Status: DC | PRN
  Administered 2013-02-13: 75 ug/kg/min via INTRAVENOUS

## 2013-02-13 SURGICAL SUPPLY — 65 items
BAG ZIPLOCK 12X15 (MISCELLANEOUS) ×3 IMPLANT
BANDAGE ELASTIC 6 VELCRO ST LF (GAUZE/BANDAGES/DRESSINGS) ×3 IMPLANT
BANDAGE ESMARK 6X9 LF (GAUZE/BANDAGES/DRESSINGS) ×2 IMPLANT
BLADE SAG 18X100X1.27 (BLADE) ×3 IMPLANT
BLADE SAW SGTL 11.0X1.19X90.0M (BLADE) ×3 IMPLANT
BNDG ESMARK 6X9 LF (GAUZE/BANDAGES/DRESSINGS) ×3
BOWL SMART MIX CTS (DISPOSABLE) ×3 IMPLANT
CEMENT HV SMART SET (Cement) ×6 IMPLANT
CLOTH BEACON ORANGE TIMEOUT ST (SAFETY) ×3 IMPLANT
CUFF TOURN SGL QUICK 18 (TOURNIQUET CUFF) IMPLANT
CUFF TOURN SGL QUICK 34 (TOURNIQUET CUFF) ×1
CUFF TRNQT CYL 34X4X40X1 (TOURNIQUET CUFF) ×2 IMPLANT
DRAPE C-ARM 42X72 X-RAY (DRAPES) IMPLANT
DRAPE C-ARMOR (DRAPES) IMPLANT
DRAPE EXTREMITY T 121X128X90 (DRAPE) ×3 IMPLANT
DRAPE INCISE IOBAN 66X45 STRL (DRAPES) ×3 IMPLANT
DRAPE ORTHO SPLIT 77X108 STRL (DRAPES)
DRAPE POUCH INSTRU U-SHP 10X18 (DRAPES) ×3 IMPLANT
DRAPE SURG ORHT 6 SPLT 77X108 (DRAPES) IMPLANT
DRAPE U-SHAPE 47X51 STRL (DRAPES) ×3 IMPLANT
DRSG ADAPTIC 3X8 NADH LF (GAUZE/BANDAGES/DRESSINGS) ×3 IMPLANT
DRSG PAD ABDOMINAL 8X10 ST (GAUZE/BANDAGES/DRESSINGS) IMPLANT
DURAPREP 26ML APPLICATOR (WOUND CARE) ×3 IMPLANT
ELECT REM PT RETURN 9FT ADLT (ELECTROSURGICAL) ×3
ELECTRODE REM PT RTRN 9FT ADLT (ELECTROSURGICAL) ×2 IMPLANT
EVACUATOR 1/8 PVC DRAIN (DRAIN) ×3 IMPLANT
FACESHIELD LNG OPTICON STERILE (SAFETY) ×15 IMPLANT
GLOVE BIO SURGEON STRL SZ7.5 (GLOVE) ×3 IMPLANT
GLOVE BIO SURGEON STRL SZ8 (GLOVE) ×6 IMPLANT
GLOVE BIOGEL PI IND STRL 8 (GLOVE) ×4 IMPLANT
GLOVE BIOGEL PI INDICATOR 8 (GLOVE) ×2
GLOVE SURG SS PI 6.5 STRL IVOR (GLOVE) ×6 IMPLANT
GOWN STRL NON-REIN LRG LVL3 (GOWN DISPOSABLE) ×6 IMPLANT
GOWN STRL REIN XL XLG (GOWN DISPOSABLE) ×3 IMPLANT
HANDPIECE INTERPULSE COAX TIP (DISPOSABLE) ×1
IMMOBILIZER KNEE 20 (SOFTGOODS)
IMMOBILIZER KNEE 20 THIGH 36 (SOFTGOODS) IMPLANT
KIT BASIN OR (CUSTOM PROCEDURE TRAY) ×3 IMPLANT
MANIFOLD NEPTUNE II (INSTRUMENTS) ×3 IMPLANT
NDL SAFETY ECLIPSE 18X1.5 (NEEDLE) ×2 IMPLANT
NEEDLE HYPO 18GX1.5 SHARP (NEEDLE) ×1
NS IRRIG 1000ML POUR BTL (IV SOLUTION) ×3 IMPLANT
PACK TOTAL JOINT (CUSTOM PROCEDURE TRAY) ×3 IMPLANT
PAD ABD 7.5X8 STRL (GAUZE/BANDAGES/DRESSINGS) ×3 IMPLANT
PADDING CAST ABS 6INX4YD NS (CAST SUPPLIES) ×2
PADDING CAST ABS COTTON 6X4 NS (CAST SUPPLIES) ×4 IMPLANT
PADDING CAST COTTON 6X4 STRL (CAST SUPPLIES) ×9 IMPLANT
POSITIONER SURGICAL ARM (MISCELLANEOUS) ×3 IMPLANT
SET HNDPC FAN SPRY TIP SCT (DISPOSABLE) ×2 IMPLANT
SPONGE GAUZE 4X4 12PLY (GAUZE/BANDAGES/DRESSINGS) ×3 IMPLANT
SPONGE LAP 18X18 X RAY DECT (DISPOSABLE) IMPLANT
STAPLER VISISTAT 35W (STAPLE) IMPLANT
STRIP CLOSURE SKIN 1/2X4 (GAUZE/BANDAGES/DRESSINGS) ×6 IMPLANT
SUCTION FRAZIER 12FR DISP (SUCTIONS) ×3 IMPLANT
SUT MNCRL AB 4-0 PS2 18 (SUTURE) ×3 IMPLANT
SUT VIC AB 0 CT1 36 (SUTURE) ×6 IMPLANT
SUT VIC AB 2-0 CT1 27 (SUTURE) ×3
SUT VIC AB 2-0 CT1 TAPERPNT 27 (SUTURE) ×6 IMPLANT
SUT VLOC 180 0 24IN GS25 (SUTURE) ×3 IMPLANT
SYR 50ML LL SCALE MARK (SYRINGE) ×3 IMPLANT
TOWEL OR 17X26 10 PK STRL BLUE (TOWEL DISPOSABLE) ×6 IMPLANT
TRAY FOLEY CATH 14FRSI W/METER (CATHETERS) ×3 IMPLANT
UNDERPAD 30X30 INCONTINENT (UNDERPADS AND DIAPERS) IMPLANT
WATER STERILE IRR 1500ML POUR (IV SOLUTION) ×3 IMPLANT
WRAP KNEE MAXI GEL POST OP (GAUZE/BANDAGES/DRESSINGS) ×6 IMPLANT

## 2013-02-13 NOTE — Interval H&P Note (Signed)
History and Physical Interval Note:  02/13/2013 3:46 PM  Denise Patton  has presented today for surgery, with the diagnosis of OA LEFT KNEE   The various methods of treatment have been discussed with the patient and family. After consideration of risks, benefits and other options for treatment, the patient has consented to  Procedure(s) with comments: TOTAL KNEE ARTHROPLASTY (Left) REMOVAL OF TIBIA NAIL  (Left) - REMOVAL OF TIBIA NAIL  as a surgical intervention .  The patient's history has been reviewed, patient examined, no change in status, stable for surgery.  I have reviewed the patient's chart and labs.  Questions were answered to the patient's satisfaction.     Loanne Drilling

## 2013-02-13 NOTE — Op Note (Signed)
Pre-operative diagnosis- Osteoarthritis  Right knee with retained hardware  Post-operative diagnosis- Osteoarthritis Right knee with retained hardware  Procedure-  Right  Total Knee Arthroplasty  Surgeon- Gus Rankin. Teran Knittle, MD  Assistant- Avel Peace, PA-C   Anesthesia-  Spinal EBL-* No blood loss amount entered *  Drains Hemovac  Tourniquet time- 52 minutes @ 300 mmHg    Complications- None  Condition-PACU - hemodynamically stable.   Brief Clinical Note  Denise Patton is a 62 y.o. year old female with end stage OA of her left knee with progressively worsening pain and dysfunction. She has constant pain, with activity and at rest and significant functional deficits with difficulties even with ADLs. She has had extensive non-op management including analgesics, injections of cortisone and viscosupplements, and home exercise program, but remains in significant pain with significant dysfunction. Radiographs show bone on bone arthritis medial and patellofemoral with a retained tibial nail. She presents now for left Total Knee Arthroplasty with possible tibial nail removal.  Procedure in detail---   The patient is brought into the operating room and positioned supine on the operating table. After successful administration of  Spinal,   a tourniquet is placed high on the  Left thigh(s) and the lower extremity is prepped and draped in the usual sterile fashion. Time out is performed by the operating team and then the  Left lower extremity is wrapped in Esmarch, knee flexed and the tourniquet inflated to 300 mmHg.       A midline incision is made with a ten blade through the subcutaneous tissue to the level of the extensor mechanism. A fresh blade is used to make a medial parapatellar arthrotomy. Soft tissue over the proximal medial tibia is subperiosteally elevated to the joint line with a knife and into the semimembranosus bursa with a Cobb elevator. Soft tissue over the proximal lateral tibia is  elevated with attention being paid to avoiding the patellar tendon on the tibial tubercle. The patella is everted, knee flexed 90 degrees and the ACL and PCL are removed. Findings are bone on bone medial and patellofemoral with significant synovitis.        The drill is used to create a starting hole in the distal femur and the canal is thoroughly irrigated with sterile saline to remove the fatty contents. The 5 degree Left  valgus alignment guide is placed into the femoral canal and the distal femoral cutting block is pinned to remove 10 mm off the distal femur. Resection is made with an oscillating saw.      The tibia is subluxed forward and the menisci are removed. The extramedullary alignment guide is placed referencing proximally at the medial aspect of the tibial tubercle and distally along the second metatarsal axis and tibial crest. The block is pinned to remove 2mm off the more deficient medial  side. Resection is made with an oscillating saw. Size 3 is the most appropriate size for the tibia and the proximal tibia is prepared with the modular drill and keel punch for that size. Note that the tibial nail was not blocking the placement of the trial tibial component. I went on to prepare for an MBT revision tray to get better purchase in the metadiaphyseal region and to avoid a stress riser between the tray and the nail. I could visualize the top of the nail but it was not impeding preparation for the MBT tray. We cleared a small (5 mm) section of bone away from the entry point to the  nail and threaded the removal instrument into the top of the nail. Despite numerous attempts to extract the nail, it would not move at all. I felt that she probably had bony ingrowth into the interlock holes of the nail, and since the nail was not impeding placement of the tibial tray, I decided to leave it intact.      The femoral sizing guide is placed and size 3 is most appropriate. Rotation is marked off the epicondylar  axis and confirmed by creating a rectangular flexion gap at 90 degrees. The size 3 cutting block is pinned in this rotation and the anterior, posterior and chamfer cuts are made with the oscillating saw. The intercondylar block is then placed and that cut is made.      Trial size 3 tibial component, trial size 3 posterior stabilized femur and a 10  mm posterior stabilized rotating platform insert trial is placed. Full extension is achieved with excellent varus/valgus and anterior/posterior balance throughout full range of motion. The patella is everted and thickness measured to be 22  mm. Free hand resection is taken to 12 mm, a 35 template is placed, lug holes are drilled, trial patella is placed, and it tracks normally. Osteophytes are removed off the posterior femur with the trial in place. All trials are removed and the cut bone surfaces prepared with pulsatile lavage. Cement is mixed and once ready for implantation, the size 3 tibial implant, size  3 posterior stabilized femoral component, and the size 35 patella are cemented in place and the patella is held with the clamp. The trial insert is placed and the knee held in full extension. The Exparel (20 ml mixed with 50 ml saline) is injected into the extensor mechanism, posterior capsule, medial and lateral gutters and subcutaneous tissues.  All extruded cement is removed and once the cement is hard the permanent 10 mm posterior stabilized rotating platform insert is placed into the tibial tray.      The wound is copiously irrigated with saline solution and the extensor mechanism closed over a hemovac drain with #1 PDS suture. The tourniquet is released for a total tourniquet time of 52  minutes. Flexion against gravity is 140 degrees and the patella tracks normally. Subcutaneous tissue is closed with 2.0 vicryl and subcuticular with running 4.0 Monocryl. The incision is cleaned and dried and steri-strips and a bulky sterile dressing are applied. The limb is  placed into a knee immobilizer and the patient is awakened and transported to recovery in stable condition.      Please note that a surgical assistant was a medical necessity for this procedure in order to perform it in a safe and expeditious manner. Surgical assistant was necessary to retract the ligaments and vital neurovascular structures to prevent injury to them and also necessary for proper positioning of the limb to allow for anatomic placement of the prosthesis.   Gus Rankin Pallas Wahlert, MD    02/13/2013, 5:31 PM

## 2013-02-13 NOTE — Anesthesia Preprocedure Evaluation (Signed)
Anesthesia Evaluation  Patient identified by MRN, date of birth, ID band Patient awake    Reviewed: Allergy & Precautions, H&P , NPO status , Patient's Chart, lab work & pertinent test results  Airway Mallampati: II TM Distance: >3 FB Neck ROM: Full    Dental no notable dental hx.    Pulmonary neg pulmonary ROS,  breath sounds clear to auscultation  Pulmonary exam normal       Cardiovascular negative cardio ROS  Rhythm:Regular Rate:Normal     Neuro/Psych  Headaches, negative psych ROS   GI/Hepatic Neg liver ROS, GERD-  Medicated,  Endo/Other  Morbid obesity  Renal/GU negative Renal ROS  negative genitourinary   Musculoskeletal negative musculoskeletal ROS (+)   Abdominal (+) + obese,   Peds negative pediatric ROS (+)  Hematology negative hematology ROS (+)   Anesthesia Other Findings   Reproductive/Obstetrics negative OB ROS                           Anesthesia Physical Anesthesia Plan  ASA: II  Anesthesia Plan: Spinal   Post-op Pain Management:    Induction: Intravenous  Airway Management Planned:   Additional Equipment:   Intra-op Plan:   Post-operative Plan: Extubation in OR  Informed Consent: I have reviewed the patients History and Physical, chart, labs and discussed the procedure including the risks, benefits and alternatives for the proposed anesthesia with the patient or authorized representative who has indicated his/her understanding and acceptance.   Dental advisory given  Plan Discussed with: CRNA  Anesthesia Plan Comments: (Discussed risks/benefits of spinal including headache, backache, failure, bleeding, infection, and nerve damage. Patient consents to spinal. Questions answered. Coagulation studies and platelet count acceptable.)        Anesthesia Quick Evaluation

## 2013-02-13 NOTE — Transfer of Care (Signed)
Immediate Anesthesia Transfer of Care Note  Patient: Denise Patton  Procedure(s) Performed: Procedure(s) with comments: TOTAL KNEE ARTHROPLASTY (Left) REMOVAL OF TIBIA NAIL  (Left) - REMOVAL OF TIBIA NAIL   Patient Location: PACU  Anesthesia Type:Regional  Level of Consciousness: awake, alert  and oriented  Airway & Oxygen Therapy: Patient Spontanous Breathing and Patient connected to face mask oxygen  Post-op Assessment: Report given to PACU RN and Post -op Vital signs reviewed and stable  Post vital signs: Reviewed and stable  Complications: No apparent anesthesia complications

## 2013-02-13 NOTE — Anesthesia Postprocedure Evaluation (Addendum)
  Anesthesia Post-op Note  Patient: Denise Patton  Procedure(s) Performed: Procedure(s) (LRB): TOTAL KNEE ARTHROPLASTY (Left) REMOVAL OF TIBIA NAIL  (Left)  Patient Location: PACU  Anesthesia Type: Spinal  Level of Consciousness: awake and alert   Airway and Oxygen Therapy: Patient Spontanous Breathing  Post-op Pain: mild  Post-op Assessment: Post-op Vital signs reviewed, Patient's Cardiovascular Status Stable, Respiratory Function Stable, Patent Airway and No signs of Nausea or vomiting  Last Vitals:  Filed Vitals:   02/13/13 1945  BP: 154/85  Pulse: 84  Temp: 36.7 C  Resp: 18    Post-op Vital Signs: stable   Complications: No apparent anesthesia complications. Moving both feet.

## 2013-02-13 NOTE — H&P (View-Only) (Signed)
Denise Patton  DOB: 11/01/1951 Married / Language: English / Race: White Female  Date of Admission:  02/13/2013  Chief Complaint:  Left Knee Pain  History of Present Illness The patient is a 62 year old female who comes in for a preoperative History and Physical. The patient is scheduled for a left total knee arthroplasty (with hardware removal) to be performed by Dr. Frank V. Aluisio, MD at [facility] on 02/13/2013. The patient is a 62 year old female who presents for follow up of their knee. The patient is being followed for their left knee pain and osteoarthritis. The patient feels that they are doing poorly and report their pain level to be severe and 6-8 / 10. The following medication has been used for pain control: Oxycodone (5mg). The patient indicates that they have questions or concerns regarding pain, activity and their progress at this point. The right knee is now hurting as bad as the left. Unfortunately the Synvisc did not help the left. She said she is unable to do activities she desires. She has been putting on weight because she can no longer exercise. The knees are even bothering her at night. She is at a point where she would like to go ahead and get the knee replaced. They have been treated conservatively in the past for the above stated problem and despite conservative measures, they continue to have progressive pain and severe functional limitations and dysfunction. They have failed non-operative management including home exercise, medications. It is felt that they would benefit from undergoing total joint replacement. Risks and benefits of the procedure have been discussed with the patient and they elect to proceed with surgery. There are no active contraindications to surgery such as ongoing infection or rapidly progressive neurological disease.   Problem List Primary osteoarthritis of one knee (715.16)   Allergies Codeine/Codeine Derivatives. does  not get much relief from it even in high doses   Family History Heart Disease. grandfather mothers side grandmother fathers side Diabetes Mellitus. father Osteoarthritis. grandmother mothers side Hypertension. mother Cerebrovascular Accident. grandmother fathers side Cancer. grandmother fathers side   Social History Marital status. married Living situation. live with spouse Pain Contract. no Number of flights of stairs before winded. 2-3 Drug/Alcohol Rehab (Previously). no Drug/Alcohol Rehab (Currently). no Illicit drug use. no Exercise. Exercises weekly; does other Tobacco use. never smoker Current work status. retired unemployed Children. 3 Alcohol use. current drinker; drinks wine; 5-7 per week current drinker; drinks wine; less than 5 per week Post-Surgical Plans. Plan for home.   Medication History ZyrTEC Allergy (10MG Capsule, Oral) Active. Dexilant (30MG Capsule DR, Oral) Active. Aleve ( Oral) Specific dose unknown - Active. Levsin/SL (0.125MG Tab Sublingual, Sublingual) Active.   Past Surgical History Gallbladder Surgery. laporoscopic Arthroscopic Knee Surgery - Left Left Tibial Nailing   Medical History Gastroesophageal Reflux Disease Chronic Cystitis Migraine Headache Irritable bowel syndrome   Review of Systems General:Not Present- Chills, Fever, Night Sweats, Fatigue, Weight Gain, Weight Loss and Memory Loss. Skin:Not Present- Hives, Itching, Rash, Eczema and Lesions. HEENT:Not Present- Tinnitus, Headache, Double Vision, Visual Loss, Hearing Loss and Dentures. Respiratory:Not Present- Shortness of breath with exertion, Shortness of breath at rest, Allergies, Coughing up blood and Chronic Cough. Note:Recent cold that has resolved. Cardiovascular:Not Present- Chest Pain, Racing/skipping heartbeats, Difficulty Breathing Lying Down, Murmur, Swelling and Palpitations. Gastrointestinal:Not Present- Bloody Stool,  Heartburn, Abdominal Pain, Vomiting, Nausea, Constipation, Diarrhea, Difficulty Swallowing, Jaundice and Loss of appetitie. Female Genitourinary:Not Present- Blood in Urine, Urinary frequency,   Weak urinary stream, Discharge, Flank Pain, Incontinence, Painful Urination, Urgency, Urinary Retention and Urinating at Night. Musculoskeletal:Not Present- Muscle Weakness, Muscle Pain, Joint Swelling, Joint Pain, Back Pain, Morning Stiffness and Spasms. Neurological:Not Present- Tremor, Dizziness, Blackout spells, Paralysis, Difficulty with balance and Weakness. Psychiatric:Not Present- Insomnia.   Vitals Pulse: 96 (Regular) Resp.: 20 (Unlabored) BP: 154/82 (Sitting, Right Arm, Standard)    Physical Exam The physical exam findings are as follows:  Note: Patient is a 62 year old female with continued knee pain.   General Mental Status - Alert, cooperative and good historian. General Appearance- pleasant. Not in acute distress. Orientation- Oriented X3. Build & Nutrition- Well nourished and Well developed.   Head and Neck Head- normocephalic, atraumatic . Neck Global Assessment- supple. no bruit auscultated on the right and no bruit auscultated on the left.   Eye Vision- Wears corrective lenses. Sclera/Conjunctiva- Bilateral- Note: Injected appearance bilateral Pupil- Bilateral- Regular and Round. Motion- Bilateral- EOMI.   Chest and Lung Exam Auscultation: Breath sounds:- clear at anterior chest wall and - clear at posterior chest wall. Adventitious sounds:- No Adventitious sounds.   Cardiovascular Auscultation:Rhythm- Regular rate and rhythm. Heart Sounds- S1 WNL and S2 WNL. Murmurs & Other Heart Sounds:Auscultation of the heart reveals - No Murmurs.   Abdomen Inspection:Contour- Generalized moderate distention. Palpation/Percussion:Tenderness- Abdomen is non-tender to palpation. Rigidity (guarding)- Abdomen is  soft. Auscultation:Auscultation of the abdomen reveals - Bowel sounds normal.   Female Genitourinary Not done, not pertinent to present illness  Musculoskeletal Well developed female alert and oriented in no apparent distress. Both knees show no effusion. Her left knee range of motion is about 5 to 125. Moderate crepitus on range of motion. Tender lateral and medial with no instability noted. Right knee no effusion. Range 0 to 125. Slight crepitus on range of motion, slight tenderness medial greater than lateral with no instability.  RADIOGRAPHS: AP both knees and lateral and right knee shows minimal joint space narrowing. It is unchanged from previous films. The left knee unfortunately has significant collapse in the lateral compartment to where she is almost bone on bone. This is a big difference from her initial x-rays. Tibial nail does not have any interlocks which will come into play with regards to knee replacement.  Assessment & Plan Primary osteoarthritis of one knee (715.16) Impression: Left Knee  Note: Plan is for a Left Total Knee Replacement and Hardware Removal of Tibial Nail by Dr. Aluisio.  Plan is to go home.  PCP - Dr. Harris - Patient has been seen preoperatively and felt to be stable for surgery.  Signed electronically by DREW L PERKINS, PA-C 

## 2013-02-13 NOTE — Anesthesia Procedure Notes (Signed)
Spinal  Patient location during procedure: OR End time: 02/13/2013 4:11 PM Staffing CRNA/Resident: Enriqueta Shutter Performed by: anesthesiologist and resident/CRNA  Preanesthetic Checklist Completed: patient identified, site marked, surgical consent, pre-op evaluation, timeout performed, IV checked, risks and benefits discussed and monitors and equipment checked Spinal Block Patient position: sitting Prep: Betadine Patient monitoring: heart rate, continuous pulse ox and blood pressure Approach: midline Location: L3-4 Injection technique: single-shot Needle Needle type: Pencil-Tip and Sprotte  Needle gauge: 25 G Needle length: 5 cm Assessment Sensory level: T6 Additional Notes Expiration date of kit checked and confirmed. Patient tolerated procedure well, without complications.

## 2013-02-14 LAB — BASIC METABOLIC PANEL
Calcium: 8.9 mg/dL (ref 8.4–10.5)
GFR calc Af Amer: >90 mL/min (ref >90)
GFR calc non Af Amer: >90 mL/min (ref >90)
Potassium: 4.5 mEq/L (ref 3.5–5.1)
Sodium: 135 mEq/L (ref 135–145)

## 2013-02-14 LAB — CBC
Hemoglobin: 12 g/dL (ref 12.0–15.0)
MCH: 33.6 pg (ref 26.0–34.0)
MCHC: 33 g/dL (ref 30.0–36.0)
Platelets: 224 10*3/uL (ref 150–400)
RDW: 12.8 % (ref 11.5–15.5)

## 2013-02-14 MED ORDER — NON FORMULARY: 60.0000 mg | Freq: Every morning | Status: DC

## 2013-02-14 MED ORDER — OXYCODONE HCL 5 MG PO TABS
10.0000 mg | ORAL_TABLET | ORAL | Status: DC | PRN
  Administered 2013-02-14: 20 mg via ORAL
  Administered 2013-02-14: 10 mg via ORAL
  Administered 2013-02-15: 20 mg via ORAL
  Administered 2013-02-15 (×2): 15 mg via ORAL
  Administered 2013-02-15 (×2): 20 mg via ORAL
  Filled 2013-02-14 (×3): qty 4
  Filled 2013-02-14: qty 2
  Filled 2013-02-14 (×3): qty 4

## 2013-02-14 MED ORDER — KETOROLAC TROMETHAMINE 15 MG/ML IJ SOLN
15.0000 mg | Freq: Four times a day (QID) | INTRAMUSCULAR | Status: DC
  Administered 2013-02-14 – 2013-02-15 (×3): 15 mg via INTRAVENOUS
  Filled 2013-02-14 (×7): qty 1

## 2013-02-14 MED ORDER — DEXLANSOPRAZOLE 60 MG PO CPDR
60.0000 mg | DELAYED_RELEASE_CAPSULE | Freq: Every day | ORAL | Status: DC
  Administered 2013-02-14 – 2013-02-15 (×2): 60 mg via ORAL
  Filled 2013-02-14 (×2): qty 1

## 2013-02-14 NOTE — Care Management Note (Addendum)
    Page 1 of 2   02/15/2013     4:15:08 PM   CARE MANAGEMENT NOTE 02/15/2013  Patient:  Denise Patton, Denise Patton   Account Number:  [id number]  Date Initiated:  02/14/2013  Documentation initiated by:  Colleen Can  Subjective/Objective Assessment:   dx total left knee replacemnt    Pre-arranged with Concha Pyo for [facility] services     Action/Plan:   CM spoke with patient. Plans are for patient to rturn to her home in [place] where spouse will be caregiver. She will need RW and 3N1. Wants Genevieve Norlander for [facility] services   Anticipated DC Date:  02/16/2012   Anticipated DC Plan:  HOME W HOME HEALTH SERVICES  In-house referral  NA      DC Planning Services  CM consult      [facility] - Inpatient Choice  HOME HEALTH  DURABLE MEDICAL EQUIPMENT   Choice offered to / List presented to:  C-1 Patient   DME arranged  3-N-1  Levan Hurst      DME agency  [facility]     HH arranged  HH-2 PT      [facility] agency  [facility]   Status of service:  Completed, signed off Medicare Important Message given?   (If response is "NO", the following Medicare IM given date fields will be blank) Date Medicare IM given:   Date Additional Medicare IM given:    Discharge Disposition:  HOME W HOME HEALTH SERVICES  Per UR Regulation:  Reviewed for med. necessity/level of care/duration of stay  If discussed at Long Length of Stay Meetings, dates discussed:    Comments:  02/15/2013 Gust Eugene bsn rn ccm [phone] PTFOR D/C TODAY WITH hh SERVICES IN PLACE. [facility] WILL START SERVICES TOMOROW 02/16/2013 [facility][ HAS DELIVERED DME TO PT'S ROOM.

## 2013-02-14 NOTE — Progress Notes (Signed)
OT Cancellation Note/Screen  Patient Details Name: ARLIENE ROSENOW MRN: [id number] DOB: 12-Jun-1951   Cancelled Treatment:    Reason Eval/Treat Not Completed: Other (comment) Screen only:  No OT needs.    Neko Boyajian 02/14/2013, 11:05 AM Marica Otter, OTR/L [phone] 02/14/2013

## 2013-02-14 NOTE — Progress Notes (Signed)
Physical Therapy Treatment Patient Details Name: Denise Patton MRN: [id number] DOB: 19-Oct-1951 Today's Date: 02/14/2013 Time: 4098-1191 PT Time Calculation (min): 24 min  PT Assessment / Plan / Recommendation Comments on Treatment Session  Progressing well with mobility. Plan is for possible d/c home on tomorrow. Recommend HHPT.     Follow Up Recommendations  Home health PT     Does the patient have the potential to tolerate intense rehabilitation     Barriers to Discharge        Equipment Recommendations  Rolling walker with 5" wheels    Recommendations for Other Services    Frequency 7X/week   Plan Discharge plan remains appropriate    Precautions / Restrictions Precautions Precautions: Knee;Fall Required Braces or Orthoses: Knee Immobilizer - Left Knee Immobilizer - Left: Discontinue once straight leg raise with < 10 degree lag Restrictions Weight Bearing Restrictions: No LLE Weight Bearing: Weight bearing as tolerated   Pertinent Vitals/Pain 8/10 L knee after ambulation and exercises    Mobility  Bed Mobility Bed Mobility: Supine to Sit;Sit to Supine Supine to Sit: 4: Min assist Sit to Supine: 4: Min assist Details for Bed Mobility Assistance: Assist for L LE onto/off bed.  Transfers Transfers: Sit to Stand;Stand to Sit Sit to Stand: 4: Min assist;From bed;From chair/3-in-1 Stand to Sit: To bed;To chair/3-in-1;4: Min assist Details for Transfer Assistance: VCS safety, technique, hand placement. Assist to rise, stabilize, control descent Ambulation/Gait Ambulation Distance (Feet): 70 Feet Assistive device: Rolling walker Ambulation/Gait Assistance Details: VCs safety, technique, sequence, posture. Slow gait speed Gait Pattern: Step-to pattern;Antalgic;Trunk flexed;Decreased step length - right;Decreased step length - left;Decreased stride length    Exercises Total Joint Exercises Ankle Circles/Pumps: AROM;Both;10 reps;Supine Quad Sets: AROM;Both;10  reps;Supine Short Arc Quad: AAROM;Left;10 reps;Supine Heel Slides: AAROM;Left;10 reps;Supine Hip ABduction/ADduction: AAROM;Left;10 reps;Supine Straight Leg Raises: AAROM;Left;10 reps;Supine   PT Diagnosis:    PT Problem List:   PT Treatment Interventions:     PT Goals Acute Rehab PT Goals Pt will go Supine/Side to Sit: with supervision PT Goal: Supine/Side to Sit - Progress: Progressing toward goal Pt will go Sit to Supine/Side: with supervision PT Goal: Sit to Supine/Side - Progress: Progressing toward goal Pt will go Sit to Stand: with supervision PT Goal: Sit to Stand - Progress: Progressing toward goal Pt will Ambulate: 51 - 150 feet;with supervision;with rolling walker PT Goal: Ambulate - Progress: Progressing toward goal Pt will Perform Home Exercise Program: with supervision, verbal cues required/provided PT Goal: Perform Home Exercise Program - Progress: Progressing toward goal  Visit Information  Last PT Received On: 02/14/13 Assistance Needed: +1    Subjective Data  Subjective: We made it (knee) angry, didn't we? Patient Stated Goal: home   Cognition       Balance     End of Session PT - End of Session Equipment Utilized During Treatment: Gait belt;Left knee immobilizer Activity Tolerance: Patient tolerated treatment well Patient left: in bed;with call bell/phone within reach   GP     Rebeca Alert [facility] 02/14/2013, 4:21 PM [phone]

## 2013-02-14 NOTE — Progress Notes (Signed)
   Subjective: 1 Day Post-Op Procedure(s) (LRB): TOTAL KNEE ARTHROPLASTY (Left) REMOVAL OF TIBIA NAIL  (Left) Patient reports pain as moderate and severe after the spinal wore off but much better control this morning.  Patient seen in rounds with Dr. Lequita Halt. Patient is well, and has had no acute complaints or problems We will start therapy today.  Plan is to go Home after hospital stay.  Objective: Vital signs in last 24 hours: Temp:  [97.6 F (36.4 C)-98.7 F (37.1 C)] 97.7 F (36.5 C) (02/25 0503) Pulse Rate:  [71-110] 88 (02/25 0503) Resp:  [9-24] 24 (02/25 0503) BP: (131-156)/(67-92) 139/84 mmHg (02/25 0503) SpO2:  [95 %-100 %] 99 % (02/25 0503) Weight:  [119.296 kg (263 lb)] 119.296 kg (263 lb) (02/24 2229)  Intake/Output from previous day:  Intake/Output Summary (Last 24 hours) at 02/14/13 0828 Last data filed at 02/14/13 0552  Gross per 24 hour  Intake   3175 ml  Output   3155 ml  Net     20 ml    Intake/Output this shift:    Labs:  Recent Labs  02/14/13 0420  HGB 12.0    Recent Labs  02/14/13 0420  WBC 9.1  RBC 3.57*  HCT 36.4  PLT 224    Recent Labs  02/14/13 0420  NA 135  K 4.5  CL 100  CO2 28  BUN 8  CREATININE 0.57  GLUCOSE 180*  CALCIUM 8.9   No results found for this basename: LABPT, INR,  in the last 72 hours  EXAM General - Patient is Alert, Appropriate and Oriented Extremity - Neurovascular intact Sensation intact distally Dorsiflexion/Plantar flexion intact Dressing - dressing C/D/I Motor Function - intact, moving foot and toes well on exam.  Hemovac pulled without difficulty.  Past Medical History  Diagnosis Date  . GERD (gastroesophageal reflux disease) 08-23-12    reflux controlled with Dexilant  . Headache 08-23-12    tx. migraines -with Botox inj. x3 months  . Chronic meniscal tear of knee 08-23-12    left knee meniscal tear  . URI (upper respiratory infection)     01/30/13- no fever- head congestion with greenish  drainage- none now    Assessment/Plan: 1 Day Post-Op Procedure(s) (LRB): TOTAL KNEE ARTHROPLASTY (Left) REMOVAL OF TIBIA NAIL  (Left) Principal Problem:   OA (osteoarthritis) of knee  Estimated body mass index is 40 kg/(m^2) as calculated from the following:   Height as of this encounter: 5\' 8"  (1.727 m).   Weight as of this encounter: 119.296 kg (263 lb). Advance diet Up with therapy Discharge home with home health  DVT Prophylaxis - Xarelto Weight-Bearing as tolerated to left leg No vaccines. D/C O2 and Pulse OX and try on Room [address]  Patrica Duel 02/14/2013, 8:28 AM

## 2013-02-14 NOTE — Evaluation (Signed)
Physical Therapy Evaluation Patient Details Name: Denise Patton MRN: [id number] DOB: 1951/08/29 Today's Date: 02/14/2013 Time: 0850-0910 PT Time Calculation (min): 20 min  PT Assessment / Plan / Recommendation Clinical Impression  62 yo female s/p L TKA with removal of tibial nail. On eval pt required Min assist for mobility. Anticipate pt will progress well. Recommend HHPT.     PT Assessment  Patient needs continued PT services    Follow Up Recommendations  Home health PT    Does the patient have the potential to tolerate intense rehabilitation      Barriers to Discharge        Equipment Recommendations  Rolling walker with 5" wheels    Recommendations for Other Services OT consult   Frequency 7X/week    Precautions / Restrictions Precautions Precautions: Knee;Fall Required Braces or Orthoses: Knee Immobilizer - Left Knee Immobilizer - Left: Discontinue once straight leg raise with < 10 degree lag Restrictions Weight Bearing Restrictions: No LLE Weight Bearing: Weight bearing as tolerated   Pertinent Vitals/Pain 5/10 L knee      Mobility  Bed Mobility Bed Mobility: Supine to Sit Supine to Sit: 4: Min assist Details for Bed Mobility Assistance: Assist for L LE off bed.  Transfers Transfers: Sit to Stand;Stand to Sit Sit to Stand: 4: Min assist;From bed Stand to Sit: 4: Min assist;To chair/3-in-1 Details for Transfer Assistance: VCS safety, technique, hand placement. Assist to rise, stabilize, control descent Ambulation/Gait Ambulation/Gait Assistance: 4: Min assist Ambulation Distance (Feet): 30 Feet Assistive device: Rolling walker Ambulation/Gait Assistance Details: VCs safety, technique, sequence. Slow gait speed.  Gait Pattern: Step-to pattern;Antalgic;Decreased stride length;Decreased step length - right;Decreased step length - left    Exercises     PT Diagnosis: Difficulty walking;Abnormality of gait;Acute pain  PT Problem List: Decreased  strength;Decreased range of motion;Decreased activity tolerance;Decreased mobility;Decreased knowledge of precautions;Decreased knowledge of use of DME PT Treatment Interventions: DME instruction;Gait training;Stair training;Functional mobility training;Therapeutic activities;Therapeutic exercise;Patient/family education   PT Goals Acute Rehab PT Goals PT Goal Formulation: With patient Time For Goal Achievement: 02/21/13 Potential to Achieve Goals: Good Pt will go Supine/Side to Sit: with supervision PT Goal: Supine/Side to Sit - Progress: Goal set today Pt will go Sit to Supine/Side: with supervision PT Goal: Sit to Supine/Side - Progress: Goal set today Pt will go Sit to Stand: with supervision PT Goal: Sit to Stand - Progress: Goal set today Pt will Ambulate: 51 - 150 feet;with supervision;with rolling walker PT Goal: Ambulate - Progress: Goal set today Pt will Go Up / Down Stairs: 6-9 stairs;with rail(s);with least restrictive assistive device PT Goal: Up/Down Stairs - Progress: Goal set today Pt will Perform Home Exercise Program: with supervision, verbal cues required/provided PT Goal: Perform Home Exercise Program - Progress: Goal set today  Visit Information  Last PT Received On: 02/14/13 Assistance Needed: +1    Subjective Data  Subjective: If you had said I would be doing this, I wouldn't have believed you Patient Stated Goal: home   Prior Functioning  Home Living Lives With: Spouse;Son Available Help at Discharge: Family Type of Home: House Home Access: Stairs to enter [employer] of Steps: 3. no rail but has post Home Layout: Two level Bathroom Shower/Tub: Naval architect Equipment: Hand-held shower hose Prior Function Level of Independence: Independent Able to Take Stairs?: Yes Communication Communication: No difficulties    Cognition  Cognition Overall Cognitive Status: Appears within functional limits for tasks  assessed/performed Arousal/Alertness: Awake/alert Orientation Level: Appears intact  for tasks assessed Behavior During Session: [facility] for tasks performed    Extremity/Trunk Assessment Right Lower Extremity Assessment RLE ROM/Strength/Tone: [facility] for tasks assessed Left Lower Extremity Assessment LLE ROM/Strength/Tone: Deficits LLE ROM/Strength/Tone Deficits: hip flex 2/5, moves ankle well Trunk Assessment Trunk Assessment: Normal   Balance    End of Session PT - End of Session Equipment Utilized During Treatment: Gait belt;Left knee immobilizer Activity Tolerance: Patient tolerated treatment well Patient left: in chair;with call bell/phone within reach CPM Left Knee CPM Left Knee: Off  GP     Rebeca Alert [facility] 02/14/2013, 10:11 AM [phone]

## 2013-02-15 ENCOUNTER — Encounter (HOSPITAL_COMMUNITY): Payer: Self-pay | Admitting: Orthopedic Surgery

## 2013-02-15 LAB — BASIC METABOLIC PANEL
GFR calc Af Amer: >90 mL/min (ref >90)
GFR calc non Af Amer: >90 mL/min (ref >90)
Potassium: 3.7 mEq/L (ref 3.5–5.1)
Sodium: 137 mEq/L (ref 135–145)

## 2013-02-15 LAB — CBC
MCHC: 33.4 g/dL (ref 30.0–36.0)
RDW: 12.8 % (ref 11.5–15.5)

## 2013-02-15 MED ORDER — METHOCARBAMOL 500 MG PO TABS: 500.0000 mg | ORAL_TABLET | Freq: Four times a day (QID) | ORAL | Status: AC | PRN

## 2013-02-15 MED ORDER — OXYCODONE HCL 10 MG PO TABS: 10.0000 mg | ORAL_TABLET | ORAL | Status: AC | PRN

## 2013-02-15 MED ORDER — RIVAROXABAN 10 MG PO TABS: 10.0000 mg | ORAL_TABLET | Freq: Every day | ORAL | Status: AC

## 2013-02-15 NOTE — Progress Notes (Signed)
Physical Therapy Treatment Patient Details Name: Denise Patton MRN: [id number] DOB: 1951-10-20 Today's Date: 02/15/2013 Time: 4098-1191 PT Time Calculation (min): 14 min  PT Assessment / Plan / Recommendation Comments on Treatment Session  2nd session. Pt planning to d/c later today. Instructed pt to perform exercises one more time this evening at home. Recommend HHPT.     Follow Up Recommendations  Home health PT     Does the patient have the potential to tolerate intense rehabilitation     Barriers to Discharge        Equipment Recommendations  Rolling walker with 5" wheels    Recommendations for Other Services OT consult  Frequency 7X/week   Plan Discharge plan remains appropriate    Precautions / Restrictions Precautions Precautions: Knee;Fall Required Braces or Orthoses: Knee Immobilizer - Left (Ki DC'd 02/15/13-able to SLR) Knee Immobilizer - Left: Discontinue once straight leg raise with < 10 degree lag Restrictions Weight Bearing Restrictions: No LLE Weight Bearing: Weight bearing as tolerated   Pertinent Vitals/Pain 4/10 L knee    Mobility  Transfers Transfers: Sit to Stand;Stand to Sit Sit to Stand: 4: Min guard;From chair/3-in-1;With armrests;With upper extremity assist Stand to Sit: 4: Min guard;To chair/3-in-1;With armrests;With upper extremity assist Details for Transfer Assistance: VCS safety, technique, hand placement.  Ambulation/Gait Ambulation/Gait Assistance: 4: Min guard Ambulation Distance (Feet): 150 Feet Assistive device: Rolling walker Ambulation/Gait Assistance Details: VCs safety, technique, sequence, posture.  Gait Pattern: Step-through pattern;Decreased stride length    Exercises    PT Diagnosis:    PT Problem List:   PT Treatment Interventions:     PT Goals Acute Rehab PT Goals Pt will go Supine/Side to Sit: with supervision PT Goal: Supine/Side to Sit - Progress: Progressing toward goal Pt will go Sit to Stand: with  supervision PT Goal: Sit to Stand - Progress: Progressing toward goal Pt will Ambulate: 51 - 150 feet;with supervision;with rolling walker PT Goal: Ambulate - Progress: Progressing toward goal Pt will Go Up / Down Stairs: 6-9 stairs;with rail(s);with least restrictive assistive device;with min assist PT Goal: Up/Down Stairs - Progress: Partly met Pt will Perform Home Exercise Program: with supervision, verbal cues required/provided PT Goal: Perform Home Exercise Program - Progress: Progressing toward goal  Visit Information  Last PT Received On: 02/15/13 Assistance Needed: +1    Subjective Data  Subjective: I guess I'll just take a nap Patient Stated Goal: home   Cognition  Cognition Overall Cognitive Status: Appears within functional limits for tasks assessed/performed Arousal/Alertness: Awake/Patton Orientation Level: Appears intact for tasks assessed Behavior During Session: [facility] for tasks performed    Balance     End of Session PT - End of Session Equipment Utilized During Treatment: Gait belt Activity Tolerance: Patient tolerated treatment well Patient left: in chair;with call bell/phone within reach CPM Left Knee CPM Left Knee: Off   GP     Denise Patton [facility] 02/15/2013, 11:44 AM [phone]

## 2013-02-15 NOTE — Progress Notes (Signed)
Physical Therapy Treatment Patient Details Name: Denise Patton MRN: [id number] DOB: May 02, 1951 Today's Date: 02/15/2013 Time: 3086-5784 PT Time Calculation (min): 30 min  PT Assessment / Plan / Recommendation Comments on Treatment Session  1st session. Pt planning to d/c today. Progressing very well with mobility and ROM. Will plan to see for 2nd session. REcommend HHPT.     Follow Up Recommendations  Home health PT     Does the patient have the potential to tolerate intense rehabilitation     Barriers to Discharge        Equipment Recommendations  Rolling walker with 5" wheels    Recommendations for Other Services OT consult  Frequency 7X/week   Plan Discharge plan remains appropriate    Precautions / Restrictions Precautions Precautions: Knee;Fall Required Braces or Orthoses: Knee Immobilizer - Left (KI DC'd 02/15/23-able to SLR) Knee Immobilizer - Left: Discontinue once straight leg raise with < 10 degree lag Restrictions Weight Bearing Restrictions: No LLE Weight Bearing: Weight bearing as tolerated   Pertinent Vitals/Pain 4/10 L knee    Mobility  Bed Mobility Bed Mobility: Supine to Sit Supine to Sit: 4: Min assist Details for Bed Mobility Assistance: Assist for L LE onto/off bed.  Transfers Transfers: Sit to Stand;Stand to Sit Sit to Stand: 4: Min assist;From bed Stand to Sit: 4: Min guard;To chair/3-in-1 Details for Transfer Assistance: VCS safety, technique, hand placement. Assist to rise, stabilize Ambulation/Gait Ambulation/Gait Assistance: 4: Min guard Ambulation Distance (Feet): 135 Feet Assistive device: Rolling walker Ambulation/Gait Assistance Details: VCs safety, technique, sequence, posture.  Gait Pattern: Step-through pattern;Decreased stride length Stairs: Yes Stairs Assistance: 4: Min guard Stairs Assistance Details (indicate cue type and reason): VCs safety, technique, sequence. Practiced with 1 crtuch, 1 rail for pt to get up flight of stairs  to bedroom. Discussed getting up steps to enter home-pt stated she will have 2 men (1 will be her husband) to help since she doesn't have a rail at the entrance. Briefly discussed technique (one man on each side for UE support).Pt declined to practice further-felt comfortable with task.  Stair Management Technique: Forwards;With crutches;One rail Right Number of Stairs: 4    Exercises Total Joint Exercises Ankle Circles/Pumps: AROM;Both;10 reps;Supine Quad Sets: AROM;Both;10 reps;Supine Short Arc Quad: AROM;Left;10 reps;Supine Heel Slides: AAROM;Left;10 reps;Supine Hip ABduction/ADduction: AAROM;AROM;Left;10 reps;Supine Straight Leg Raises: AROM;Left;10 reps;Supine   PT Diagnosis:    PT Problem List:   PT Treatment Interventions:     PT Goals Acute Rehab PT Goals Pt will go Supine/Side to Sit: with supervision PT Goal: Supine/Side to Sit - Progress: Progressing toward goal Pt will go Sit to Stand: with supervision PT Goal: Sit to Stand - Progress: Progressing toward goal Pt will Ambulate: 51 - 150 feet;with supervision;with rolling walker PT Goal: Ambulate - Progress: Progressing toward goal Pt will Go Up / Down Stairs: 6-9 stairs;with rail(s);with least restrictive assistive device;with min assist PT Goal: Up/Down Stairs - Progress: Partly met Pt will Perform Home Exercise Program: with supervision, verbal cues required/provided PT Goal: Perform Home Exercise Program - Progress: Progressing toward goal  Visit Information  Last PT Received On: 02/15/13 Assistance Needed: +1    Subjective Data  Subjective: Im leaving around 3 or 4 today Patient Stated Goal: home   Cognition  Cognition Overall Cognitive Status: Appears within functional limits for tasks assessed/performed Arousal/Alertness: Awake/alert Orientation Level: Appears intact for tasks assessed Behavior During Session: [facility] for tasks performed    Balance     End of Session PT -  End of Session Equipment Utilized  During Treatment: Gait belt Activity Tolerance: Patient tolerated treatment well Patient left: in chair;with call bell/phone within reach CPM Left Knee CPM Left Knee: Off   GP     Rebeca Alert [facility] 02/15/2013, 11:38 AM [phone]

## 2013-02-15 NOTE — Progress Notes (Signed)
   Subjective: 2 Days Post-Op Procedure(s) (LRB): TOTAL KNEE ARTHROPLASTY (Left) REMOVAL OF TIBIA NAIL  (Left) Patient reports pain as mild.   Patient seen in rounds by Dr. Lequita Halt. Patient is well, and has had no acute complaints or problems Patient is ready to go home.  Objective: Vital signs in last 24 hours: Temp:  [97.7 F (36.5 C)-98.5 F (36.9 C)] 97.7 F (36.5 C) (02/26 0600) Pulse Rate:  [85-89] 85 (02/26 0600) Resp:  [15-16] 15 (02/26 1200) BP: (124-147)/(72-79) 147/79 mmHg (02/26 0600) SpO2:  [96 %-99 %] 96 % (02/26 0600)  Intake/Output from previous day:  Intake/Output Summary (Last 24 hours) at 02/15/13 1349 Last data filed at 02/15/13 4098  Gross per 24 hour  Intake    860 ml  Output   1900 ml  Net  -1040 ml    Intake/Output this shift: Total I/O In: 240 [P.O.:240] Out: -   Labs:  Recent Labs  02/14/13 0420 02/15/13 0423  HGB 12.0 11.0*    Recent Labs  02/14/13 0420 02/15/13 0423  WBC 9.1 10.1  RBC 3.57* 3.21*  HCT 36.4 32.9*  PLT 224 214    Recent Labs  02/14/13 0420 02/15/13 0423  NA 135 137  K 4.5 3.7  CL 100 101  CO2 28 28  BUN 8 10  CREATININE 0.57 0.54  GLUCOSE 180* 153*  CALCIUM 8.9 8.7   No results found for this basename: LABPT, INR,  in the last 72 hours  EXAM: General - Patient is Alert, Appropriate and Oriented Extremity - Neurovascular intact Sensation intact distally Dorsiflexion/Plantar flexion intact No cellulitis present Incision - clean, dry, no drainage, healing Motor Function - intact, moving foot and toes well on exam.   Assessment/Plan: 2 Days Post-Op Procedure(s) (LRB): TOTAL KNEE ARTHROPLASTY (Left) REMOVAL OF TIBIA NAIL  (Left) Procedure(s) (LRB): TOTAL KNEE ARTHROPLASTY (Left) REMOVAL OF TIBIA NAIL  (Left) Past Medical History  Diagnosis Date  . GERD (gastroesophageal reflux disease) 08-23-12    reflux controlled with Dexilant  . Headache 08-23-12    tx. migraines -with Botox inj. x3 months    . Chronic meniscal tear of knee 08-23-12    left knee meniscal tear  . URI (upper respiratory infection)     01/30/13- no fever- head congestion with greenish drainage- none now   Principal Problem:   OA (osteoarthritis) of knee  Estimated body mass index is 40 kg/(m^2) as calculated from the following:   Height as of this encounter: 5\' 8"  (1.727 m).   Weight as of this encounter: 119.296 kg (263 lb). Up with therapy Discharge home with home health Diet - Regular diet Follow up - in 2 weeks Activity - WBAT Disposition - Home Condition Upon Discharge - Good D/C Meds - See DC Summary DVT Prophylaxis - Xarelto  Denise Patton 02/15/2013, 1:49 PM

## 2013-02-23 NOTE — Discharge Summary (Signed)
Physician Discharge Summary   Patient ID: Denise Patton MRN: [id number] DOB/AGE: 1951-03-31 62 y.o.  Admit date: 02/13/2013 Discharge date: 02/15/2013  Primary Diagnosis:  Osteoarthritis Right knee with retained hardware  Admission Diagnoses:  Past Medical History  Diagnosis Date  . GERD (gastroesophageal reflux disease) 08-23-12    reflux controlled with Dexilant  . Headache 08-23-12    tx. migraines -with Botox inj. x3 months  . Chronic meniscal tear of knee 08-23-12    left knee meniscal tear  . URI (upper respiratory infection)     01/30/13- no fever- head congestion with greenish drainage- none now   Discharge Diagnoses:   Principal Problem:   OA (osteoarthritis) of knee  Estimated body mass index is 40 kg/(m^2) as calculated from the following:   Height as of this encounter: 5\' 8"  (1.727 m).   Weight as of this encounter: 119.296 kg (263 lb).  Procedure:  Procedure(s) (LRB): TOTAL KNEE ARTHROPLASTY (Left) REMOVAL OF TIBIA NAIL  (Left)   Consults: None  HPI: Denise Patton is a 62 y.o. year old female with end stage OA of her left knee with progressively worsening pain and dysfunction. She has constant pain, with activity and at rest and significant functional deficits with difficulties even with ADLs. She has had extensive non-op management including analgesics, injections of cortisone and viscosupplements, and home exercise program, but remains in significant pain with significant dysfunction. Radiographs show bone on bone arthritis medial and patellofemoral with a retained tibial nail. She presents now for left Total Knee Arthroplasty with possible tibial nail removal.  Laboratory Data: Admission on 02/13/2013, Discharged on 02/15/2013  Component Date Value Range Status  . ABO/RH(D) 02/13/2013 O POS   Final  . Antibody Screen 02/13/2013 NEG   Final  . Sample Expiration 02/13/2013 02/16/2013   Final  . ABO/RH(D) 02/13/2013 O POS   Final  . WBC 02/14/2013 9.1  4.0 - 10.5  K/uL Final  . RBC 02/14/2013 3.57* 3.87 - 5.11 MIL/uL Final  . Hemoglobin 02/14/2013 12.0  12.0 - 15.0 g/dL Final  . HCT 62/13/0865 36.4  36.0 - 46.0 % Final  . MCV 02/14/2013 102.0* 78.0 - 100.0 fL Final  . MCH 02/14/2013 33.6  26.0 - 34.0 pg Final  . MCHC 02/14/2013 33.0  30.0 - 36.0 g/dL Final  . RDW 78/46/9629 12.8  11.5 - 15.5 % Final  . Platelets 02/14/2013 224  150 - 400 K/uL Final  . Sodium 02/14/2013 135  135 - 145 mEq/L Final  . Potassium 02/14/2013 4.5  3.5 - 5.1 mEq/L Final  . Chloride 02/14/2013 100  96 - 112 mEq/L Final  . CO2 02/14/2013 28  19 - 32 mEq/L Final  . Glucose, Bld 02/14/2013 180* 70 - 99 mg/dL Final  . BUN 52/84/1324 8  6 - 23 mg/dL Final  . Creatinine, Ser 02/14/2013 0.57  0.50 - 1.10 mg/dL Final  . Calcium 40/09/2724 8.9  8.4 - 10.5 mg/dL Final  . GFR calc non Af Amer 02/14/2013 >90  >90 mL/min Final  . GFR calc Af Amer 02/14/2013 >90  >90 mL/min Final   Comment:                                 The eGFR has been calculated                          using the  CKD EPI equation.                          This calculation has not been                          validated in all clinical                          situations.                          eGFR's persistently                          <90 mL/min signify                          possible Chronic Kidney Disease.  . WBC 02/15/2013 10.1  4.0 - 10.5 K/uL Final  . RBC 02/15/2013 3.21* 3.87 - 5.11 MIL/uL Final  . Hemoglobin 02/15/2013 11.0* 12.0 - 15.0 g/dL Final  . HCT 16/09/9603 32.9* 36.0 - 46.0 % Final  . MCV 02/15/2013 102.5* 78.0 - 100.0 fL Final  . MCH 02/15/2013 34.3* 26.0 - 34.0 pg Final  . MCHC 02/15/2013 33.4  30.0 - 36.0 g/dL Final  . RDW 54/08/8118 12.8  11.5 - 15.5 % Final  . Platelets 02/15/2013 214  150 - 400 K/uL Final  . Sodium 02/15/2013 137  135 - 145 mEq/L Final  . Potassium 02/15/2013 3.7  3.5 - 5.1 mEq/L Final   DELTA CHECK NOTED  . Chloride 02/15/2013 101  96 - 112 mEq/L Final  .  CO2 02/15/2013 28  19 - 32 mEq/L Final  . Glucose, Bld 02/15/2013 153* 70 - 99 mg/dL Final  . BUN 14/78/2956 10  6 - 23 mg/dL Final  . Creatinine, Ser 02/15/2013 0.54  0.50 - 1.10 mg/dL Final  . Calcium 21/30/8657 8.7  8.4 - 10.5 mg/dL Final  . GFR calc non Af Amer 02/15/2013 >90  >90 mL/min Final  . GFR calc Af Amer 02/15/2013 >90  >90 mL/min Final   Comment:                                 The eGFR has been calculated                          using the CKD EPI equation.                          This calculation has not been                          validated in all clinical                          situations.                          eGFR's persistently                          <90 mL/min signify  possible Chronic Kidney Disease.  Hospital Outpatient Visit on 02/07/2013  Component Date Value Range Status  . aPTT 02/07/2013 28  24 - 37 seconds Final  . WBC 02/07/2013 6.9  4.0 - 10.5 K/uL Final  . RBC 02/07/2013 4.37  3.87 - 5.11 MIL/uL Final  . Hemoglobin 02/07/2013 14.6  12.0 - 15.0 g/dL Final  . HCT 16/09/9603 44.3  36.0 - 46.0 % Final  . MCV 02/07/2013 101.4* 78.0 - 100.0 fL Final  . MCH 02/07/2013 33.4  26.0 - 34.0 pg Final  . MCHC 02/07/2013 33.0  30.0 - 36.0 g/dL Final  . RDW 54/08/8118 12.5  11.5 - 15.5 % Final  . Platelets 02/07/2013 254  150 - 400 K/uL Final  . Sodium 02/07/2013 137  135 - 145 mEq/L Final  . Potassium 02/07/2013 4.6  3.5 - 5.1 mEq/L Final  . Chloride 02/07/2013 101  96 - 112 mEq/L Final  . CO2 02/07/2013 28  19 - 32 mEq/L Final  . Glucose, Bld 02/07/2013 111* 70 - 99 mg/dL Final  . BUN 14/78/2956 22  6 - 23 mg/dL Final  . Creatinine, Ser 02/07/2013 0.82  0.50 - 1.10 mg/dL Final  . Calcium 21/30/8657 9.3  8.4 - 10.5 mg/dL Final  . Total Protein 02/07/2013 7.8  6.0 - 8.3 g/dL Final  . Albumin 84/69/6295 3.5  3.5 - 5.2 g/dL Final  . AST 28/41/3244 14  0 - 37 U/L Final  . ALT 02/07/2013 17  0 - 35 U/L Final  . Alkaline  Phosphatase 02/07/2013 104  39 - 117 U/L Final  . Total Bilirubin 02/07/2013 0.2* 0.3 - 1.2 mg/dL Final  . GFR calc non Af Amer 02/07/2013 77* >90 mL/min Final  . GFR calc Af Amer 02/07/2013 89* >90 mL/min Final   Comment:                                 The eGFR has been calculated                          using the CKD EPI equation.                          This calculation has not been                          validated in all clinical                          situations.                          eGFR's persistently                          <90 mL/min signify                          possible Chronic Kidney Disease.  Marland Kitchen Prothrombin Time 02/07/2013 12.2  11.6 - 15.2 seconds Final  . INR 02/07/2013 0.91  0.00 - 1.49 Final  . Color, Urine 02/07/2013 YELLOW  YELLOW Final  . APPearance 02/07/2013 CLEAR  CLEAR Final  . Specific Gravity, Urine 02/07/2013 1.020  1.005 - 1.030 Final  . pH 02/07/2013  6.0  5.0 - 8.0 Final  . Glucose, UA 02/07/2013 NEGATIVE  NEGATIVE mg/dL Final  . Hgb urine dipstick 02/07/2013 NEGATIVE  NEGATIVE Final  . Bilirubin Urine 02/07/2013 NEGATIVE  NEGATIVE Final  . Ketones, ur 02/07/2013 NEGATIVE  NEGATIVE mg/dL Final  . Protein, ur 16/09/9603 NEGATIVE  NEGATIVE mg/dL Final  . Urobilinogen, UA 02/07/2013 0.2  0.0 - 1.0 mg/dL Final  . Nitrite 54/08/8118 NEGATIVE  NEGATIVE Final  . Leukocytes, UA 02/07/2013 NEGATIVE  NEGATIVE Final   MICROSCOPIC NOT DONE ON URINES WITH NEGATIVE PROTEIN, BLOOD, LEUKOCYTES, NITRITE, OR GLUCOSE <1000 mg/dL.  Marland Kitchen MRSA, PCR 02/07/2013 NEGATIVE  NEGATIVE Final  . Staphylococcus aureus 02/07/2013 NEGATIVE  NEGATIVE Final   Comment:                                 The Xpert SA Assay (FDA                          approved for NASAL specimens                          in patients over 35 years of age),                          is one component of                          a comprehensive surveillance                          program.  Test  performance has                          been validated by [employer] for patients greater                          than or equal to 42 year old.                          It is not intended                          to diagnose infection nor to                          guide or monitor treatment.     X-Rays:No results found.  EKG:No orders found for this or any previous visit.   Hospital Course: Denise Patton is a 62 y.o. who was admitted to [facility]. They were brought to the operating room on 02/13/2013 and underwent Procedure(s): TOTAL KNEE ARTHROPLASTY REMOVAL OF TIBIA NAIL .  Patient tolerated the procedure well and was later transferred to the recovery room and then to the orthopaedic floor for postoperative care.  They were given PO and IV analgesics for pain control following their surgery.  They were given 24 hours of postoperative antibiotics of  Anti-infectives   Start     Dose/Rate Route Frequency  Ordered Stop   02/13/13 2200  ceFAZolin (ANCEF) IVPB 2 g/50 mL premix     2 g 100 mL/hr over 30 Minutes Intravenous Every 6 hours 02/13/13 2018 02/14/13 0443   02/13/13 1229  ceFAZolin (ANCEF) IVPB 2 g/50 mL premix    Comments:  Adjusted to 2 Gm for patient's weighing < 120 kg (documented weight of 119kg)   2 g 100 mL/hr over 30 Minutes Intravenous 60 min pre-op 02/13/13 1229 02/13/13 1613     and started on DVT prophylaxis in the form of Xarelto.   PT and OT were ordered for total joint protocol.  Discharge planning consulted to help with postop disposition and equipment needs.  Patient had a rough night on the evening of surgery after the spinal wore off.  They started to get up OOB with therapy on day one after starting to feel better with better pain control. Hemovac drain was pulled without difficulty.  Continued to work with therapy into day two.  Dressing was changed on day two and the incision was healing well.  Patient was seen in rounds and  was ready to go home later that same day.   Discharge Medications: Prior to Admission medications   Medication Sig Start Date End Date Taking? Authorizing Provider  cetirizine (ZYRTEC) 10 MG tablet Take 10 mg by mouth daily.   Yes Historical Provider, MD  dexlansoprazole (DEXILANT) 60 MG capsule Take 60 mg by mouth daily with breakfast.   Yes Historical Provider, MD  diphenhydrAMINE (BENADRYL) 25 MG tablet Take 50 mg by mouth every 6 (six) hours as needed for itching.   Yes Historical Provider, MD  hyoscyamine (LEVSIN, ANASPAZ) 0.125 MG tablet Take 0.125 mg by mouth every 6 (six) hours as needed for cramping.   Yes Historical Provider, MD  methocarbamol (ROBAXIN) 500 MG tablet Take 1 tablet (500 mg total) by mouth every 6 (six) hours as needed. 02/15/13   Dimples Probus Julien Girt, PA-C  oxyCODONE 10 MG TABS Take 1-2 tablets (10-20 mg total) by mouth every 3 (three) hours as needed. 02/15/13   Josanna Hefel Julien Girt, PA-C  rivaroxaban (XARELTO) 10 MG TABS tablet Take 1 tablet (10 mg total) by mouth daily with breakfast. Take Xarelto for two and a half more weeks, then discontinue Xarelto. 02/15/13   Krystle Polcyn Julien Girt, PA-C    Diet: Regular diet Activity:WBAT Follow-up:in 2 weeks Disposition - Home Discharged Condition: good   Discharge Orders   Future Orders Complete By Expires     Call MD / Call 911  As directed     Comments:      If you experience chest pain or shortness of breath, CALL 911 and be transported to the hospital emergency room.  If you develope a fever above 101 F, pus (white drainage) or increased drainage or redness at the wound, or calf pain, call your surgeon's office.    Change dressing  As directed     Comments:      Change dressing daily with sterile 4 x 4 inch gauze dressing and apply TED hose. Do not submerge the incision under water.    Constipation Prevention  As directed     Comments:      Drink plenty of fluids.  Prune juice may be helpful.  You may use a stool  softener, such as Colace (over the counter) 100 mg twice a day.  Use MiraLax (over the counter) for constipation as needed.    Diet - low sodium heart healthy  As directed  Discharge instructions  As directed     Comments:      Pick up stool softner and laxative for home. Do not submerge incision under water. May shower. Continue to use ice for pain and swelling from surgery. Hip precautions.  Total Hip Protocol.  Take Xarelto for two and a half more weeks, then discontinue Xarelto.    Do not put a pillow under the knee. Place it under the heel.  As directed     Do not sit on low chairs, stoools or toilet seats, as it may be difficult to get up from low surfaces  As directed     Driving restrictions  As directed     Comments:      No driving until released by the physician.    Increase activity slowly as tolerated  As directed     Lifting restrictions  As directed     Comments:      No lifting until released by the physician.    Patient may shower  As directed     Comments:      You may shower without a dressing once there is no drainage.  Do not wash over the wound.  If drainage remains, do not shower until drainage stops.    TED hose  As directed     Comments:      Use stockings (TED hose) for 3 weeks on both leg(s).  You may remove them at night for sleeping.    Weight bearing as tolerated  As directed         Medication List    STOP taking these medications       aspirin-acetaminophen-caffeine 250-250-65 MG per tablet  Commonly known as:  EXCEDRIN MIGRAINE     BOTOX IJ     oxyCODONE-acetaminophen 5-325 MG per tablet  Commonly known as:  PERCOCET/ROXICET      TAKE these medications       cetirizine 10 MG tablet  Commonly known as:  ZYRTEC  Take 10 mg by mouth daily.     DEXILANT 60 MG capsule  Generic drug:  dexlansoprazole  Take 60 mg by mouth daily with breakfast.     diphenhydrAMINE 25 MG tablet  Commonly known as:  BENADRYL  Take 50 mg by mouth every  6 (six) hours as needed for itching.     hyoscyamine 0.125 MG tablet  Commonly known as:  LEVSIN, ANASPAZ  Take 0.125 mg by mouth every 6 (six) hours as needed for cramping.     methocarbamol 500 MG tablet  Commonly known as:  ROBAXIN  Take 1 tablet (500 mg total) by mouth every 6 (six) hours as needed.     Oxycodone HCl 10 MG Tabs  Take 1-2 tablets (10-20 mg total) by mouth every 3 (three) hours as needed.     rivaroxaban 10 MG Tabs tablet  Commonly known as:  XARELTO  Take 1 tablet (10 mg total) by mouth daily with breakfast. Take Xarelto for two and a half more weeks, then discontinue Xarelto.           Follow-up Information   Follow up with Loanne Drilling, MD. Schedule an appointment as soon as possible for a visit in 2 weeks.   Contact information:   [address], [place] [address] [place] [address] [phone]       Signed: Patrica Duel 02/23/2013, 10:00 AM

## 2013-04-05 NOTE — Clinical Social Work Psychosocial (Deleted)
     Clinical Social Work Department BRIEF PSYCHOSOCIAL ASSESSMENT 04/05/2013  Patient:  Denise Patton, Denise Patton     Account Number:  [id number]     Admit date:  02/13/2013  Clinical Social Worker:  Hulan Fray  Date/Time:  04/05/2013 04:05 PM  Referred by:  RN  Date Referred:  04/05/2013 Referred for  SNF Placement   Other Referral:   Interview type:  Patient Other interview type:    PSYCHOSOCIAL DATA Living Status:  HUSBAND Admitted from facility:   Level of care:   Primary support name:  Ulice Bold Primary support relationship to patient:  SPOUSE Degree of support available:   supportive    CURRENT CONCERNS Current Concerns  Post-Acute Placement   Other Concerns:    SOCIAL WORK ASSESSMENT / PLAN Clinical Social Worker received referral for discussion of rehab, SNF placement for patient vs CIR placement. CSW introduced self and explained reason for visit.    CSW spoke with patient and explained the SNF process to patient and the importance of doing a SNF search as well as CIR consult. CSW explained that if insurance does not approve CIR, then SNF will be the next level of care and patient voiced understanding. Patient was agreeable for CSW to initiate SNF search in [place]. focusing on [place] area, excluding [address] and both [facility]. Patient did express interest in [facility] and [facility] SNFs. CSW will initiate SNF search as a back up to CIR pending insurance approval.    CSW will complete FL2 for MD's signature and update patient when bed offers are made.   Assessment/plan status:   Other assessment/ plan:   Information/referral to community resources:   SNF packet    PATIENTS/FAMILYS RESPONSE TO PLAN OF CARE: Patient was agreeable for SNF search in [facility]. as a back up to [facility] pending insurance approval. Patient was appreciative of CSW's visit and assistance.

## 2015-03-07 ENCOUNTER — Other Ambulatory Visit: Payer: Self-pay | Admitting: Family Medicine

## 2015-03-07 ENCOUNTER — Ambulatory Visit
Admission: RE | Admit: 2015-03-07 | Discharge: 2015-03-07 | Disposition: A | Discharge status: Home / Self Care | Payer: BLUE CROSS/BLUE SHIELD | Source: Ambulatory Visit | Attending: Family Medicine | Admitting: Family Medicine

## 2015-03-07 DIAGNOSIS — R059 Cough, unspecified: Secondary | ICD-10-CM

## 2015-03-07 DIAGNOSIS — R05 Cough: Secondary | ICD-10-CM

## 2015-03-07 DIAGNOSIS — J40 Bronchitis, not specified as acute or chronic: Secondary | ICD-10-CM

## 2016-09-01 DIAGNOSIS — Z0120 Encounter for dental examination and cleaning without abnormal findings: Secondary | ICD-10-CM | POA: Diagnosis not present

## 2016-10-13 DIAGNOSIS — M25552 Pain in left hip: Secondary | ICD-10-CM | POA: Diagnosis not present

## 2016-10-13 DIAGNOSIS — M25551 Pain in right hip: Secondary | ICD-10-CM | POA: Diagnosis not present

## 2016-10-13 DIAGNOSIS — R509 Fever, unspecified: Secondary | ICD-10-CM | POA: Diagnosis not present

## 2016-10-13 DIAGNOSIS — K219 Gastro-esophageal reflux disease without esophagitis: Secondary | ICD-10-CM | POA: Diagnosis not present

## 2016-10-13 MED FILL — MELOXICAM 15 MG TABLET: 15 | 30 days supply | Qty: 30 | Fill #0

## 2016-10-13 MED FILL — DEXILANT DR 60 MG CAPSULE: 60 | 30 days supply | Qty: 30 | Fill #0

## 2016-10-14 ENCOUNTER — Other Ambulatory Visit: Payer: Self-pay | Admitting: Family Medicine

## 2016-10-14 DIAGNOSIS — Z1231 Encounter for screening mammogram for malignant neoplasm of breast: Secondary | ICD-10-CM

## 2016-10-14 MED FILL — predniSONE 20 MG TABS: 20 | 9 days supply | Qty: 18 | Fill #0

## 2016-10-20 DIAGNOSIS — M62838 Other muscle spasm: Secondary | ICD-10-CM | POA: Diagnosis not present

## 2016-10-20 DIAGNOSIS — M25552 Pain in left hip: Secondary | ICD-10-CM | POA: Diagnosis not present

## 2016-10-20 DIAGNOSIS — R609 Edema, unspecified: Secondary | ICD-10-CM | POA: Diagnosis not present

## 2016-10-20 DIAGNOSIS — M25551 Pain in right hip: Secondary | ICD-10-CM | POA: Diagnosis not present

## 2016-10-20 MED FILL — CYCLOBENZAPRINE 10 MG TAB: 10 | 25 days supply | Qty: 50 | Fill #0

## 2016-10-20 MED FILL — OXYCODONE-APAP 10-325: 10-325 | 10 days supply | Qty: 60 | Fill #0

## 2016-10-30 MED FILL — DOXYCYCLINE HYCLATE 100 MG: 100 | 10 days supply | Qty: 20 | Fill #0

## 2016-11-03 ENCOUNTER — Ambulatory Visit (HOSPITAL_COMMUNITY)
Admission: RE | Admit: 2016-11-03 | Discharge: 2016-11-03 | Disposition: A | Discharge status: Home / Self Care | Payer: 59 | Source: Ambulatory Visit | Attending: Family Medicine | Admitting: Family Medicine

## 2016-11-03 ENCOUNTER — Other Ambulatory Visit (HOSPITAL_COMMUNITY): Payer: Self-pay | Admitting: Family Medicine

## 2016-11-03 DIAGNOSIS — S7001XA Contusion of right hip, initial encounter: Secondary | ICD-10-CM | POA: Diagnosis not present

## 2016-11-03 DIAGNOSIS — M25451 Effusion, right hip: Secondary | ICD-10-CM | POA: Insufficient documentation

## 2016-11-03 DIAGNOSIS — M1611 Unilateral primary osteoarthritis, right hip: Secondary | ICD-10-CM | POA: Insufficient documentation

## 2016-11-03 DIAGNOSIS — M25551 Pain in right hip: Secondary | ICD-10-CM | POA: Diagnosis not present

## 2016-11-05 ENCOUNTER — Ambulatory Visit: Payer: BLUE CROSS/BLUE SHIELD

## 2016-11-10 DIAGNOSIS — M25551 Pain in right hip: Secondary | ICD-10-CM | POA: Diagnosis not present

## 2016-11-18 MED FILL — SUMATRIPTAN SUCC 100 MG TAB: 100 | 30 days supply | Qty: 9 | Fill #0

## 2016-11-20 MED FILL — OXYCODONE-ACETAMINOPHEN 10-: 10-325 | 10 days supply | Qty: 60 | Fill #0

## 2016-11-20 MED FILL — DEXILANT DR 60 MG CAPSULE: 60 | 90 days supply | Qty: 90 | Fill #1

## 2016-11-20 MED FILL — MELOXICAM 15 MG TABLET: 15 | 30 days supply | Qty: 30 | Fill #1

## 2016-12-07 MED FILL — CYCLOBENZAPRINE 10 MG TAB: 10 | 25 days supply | Qty: 50 | Fill #0

## 2016-12-19 MED FILL — SUMATRIPTAN SUCC 100 MG TAB: 100 | 30 days supply | Qty: 9 | Fill #1

## 2016-12-25 MED FILL — MELOXICAM 15 MG TABLET: 15 | 30 days supply | Qty: 30 | Fill #2

## 2017-01-19 MED FILL — MELOXICAM 15 MG TABLET: 15 | 30 days supply | Qty: 30 | Fill #3

## 2017-01-19 MED FILL — SUMATRIPTAN SUCC 100 MG TAB: 100 | 30 days supply | Qty: 9 | Fill #2

## 2017-02-05 MED FILL — SUMATRIPTAN SUCC 100 MG TAB: 100 | 30 days supply | Qty: 9 | Fill #3

## 2017-02-16 DIAGNOSIS — G43909 Migraine, unspecified, not intractable, without status migrainosus: Secondary | ICD-10-CM | POA: Diagnosis not present

## 2017-02-16 DIAGNOSIS — R030 Elevated blood-pressure reading, without diagnosis of hypertension: Secondary | ICD-10-CM | POA: Diagnosis not present

## 2017-02-16 DIAGNOSIS — K219 Gastro-esophageal reflux disease without esophagitis: Secondary | ICD-10-CM | POA: Diagnosis not present

## 2017-02-19 DIAGNOSIS — F4312 Post-traumatic stress disorder, chronic: Secondary | ICD-10-CM | POA: Diagnosis not present

## 2017-02-19 MED FILL — MELOXICAM 15 MG TABLET: 15 | 30 days supply | Qty: 30 | Fill #0

## 2017-02-19 MED FILL — DEXILANT DR 60 MG CAPSULE: 60 | 30 days supply | Qty: 30 | Fill #2

## 2017-03-05 DIAGNOSIS — F4312 Post-traumatic stress disorder, chronic: Secondary | ICD-10-CM | POA: Diagnosis not present

## 2017-03-09 DIAGNOSIS — F4312 Post-traumatic stress disorder, chronic: Secondary | ICD-10-CM | POA: Diagnosis not present

## 2017-03-19 DIAGNOSIS — F4312 Post-traumatic stress disorder, chronic: Secondary | ICD-10-CM | POA: Diagnosis not present

## 2017-03-23 MED FILL — DEXILANT DR 60 MG CAPSULE: 60 | 30 days supply | Qty: 30 | Fill #3

## 2017-03-23 MED FILL — MELOXICAM 15 MG TABLET: 15 | 30 days supply | Qty: 30 | Fill #1

## 2017-03-26 DIAGNOSIS — F4312 Post-traumatic stress disorder, chronic: Secondary | ICD-10-CM | POA: Diagnosis not present

## 2017-04-02 DIAGNOSIS — F4312 Post-traumatic stress disorder, chronic: Secondary | ICD-10-CM | POA: Diagnosis not present

## 2017-04-05 MED FILL — OLOPATADINE HCL 0.1 % SOLN: 0.1 | 25 days supply | Qty: 5 | Fill #0

## 2017-04-06 DIAGNOSIS — R030 Elevated blood-pressure reading, without diagnosis of hypertension: Secondary | ICD-10-CM | POA: Diagnosis not present

## 2017-04-06 DIAGNOSIS — R079 Chest pain, unspecified: Secondary | ICD-10-CM | POA: Diagnosis not present

## 2017-04-06 MED FILL — LOSARTAN POTASSIUM 50 MG TA: 50 | 30 days supply | Qty: 30 | Fill #0

## 2017-04-09 DIAGNOSIS — F4312 Post-traumatic stress disorder, chronic: Secondary | ICD-10-CM | POA: Diagnosis not present

## 2017-04-19 MED FILL — SUMATRIPTAN SUCC 100 MG TAB: 100 | 30 days supply | Qty: 9 | Fill #4

## 2017-04-20 MED FILL — MELOXICAM 15 MG TABLET: 15 | 30 days supply | Qty: 30 | Fill #2

## 2017-04-20 MED FILL — DEXILANT DR 60 MG CAPSULE: 60 | 30 days supply | Qty: 30 | Fill #4

## 2017-04-23 DIAGNOSIS — F4312 Post-traumatic stress disorder, chronic: Secondary | ICD-10-CM | POA: Diagnosis not present

## 2017-04-26 MED FILL — LOSARTAN POTASSIUM 50 MG TA: 50 | 30 days supply | Qty: 30 | Fill #1

## 2017-04-26 MED FILL — OLOPATADINE HCL 0.1 % SOLN: 0.1 | 25 days supply | Qty: 5 | Fill #1

## 2017-05-07 DIAGNOSIS — F4312 Post-traumatic stress disorder, chronic: Secondary | ICD-10-CM | POA: Diagnosis not present

## 2017-05-18 MED FILL — CYCLOBENZAPRINE 10 MG TAB: 10 | 25 days supply | Qty: 50 | Fill #0

## 2017-05-18 MED FILL — OXYCODONE-ACETAMINOPHEN 10-: 10-325 | 10 days supply | Qty: 60 | Fill #0

## 2017-05-25 MED FILL — MELOXICAM 15 MG TABLET: 15 | 30 days supply | Qty: 30 | Fill #0

## 2017-05-25 MED FILL — DEXILANT DR 60 MG CAPSULE: 60 | 30 days supply | Qty: 30 | Fill #0

## 2017-05-25 MED FILL — LOSARTAN POTASSIUM 50 MG TA: 50 | 30 days supply | Qty: 30 | Fill #2

## 2017-05-28 DIAGNOSIS — F4312 Post-traumatic stress disorder, chronic: Secondary | ICD-10-CM | POA: Diagnosis not present

## 2017-05-31 MED FILL — SUMATRIPTAN SUCC 100 MG TAB: 100 | 30 days supply | Qty: 9 | Fill #5

## 2017-07-01 MED FILL — CYCLOBENZAPRINE 10 MG TABLE: 10 | 25 days supply | Qty: 50 | Fill #1

## 2017-07-01 MED FILL — MELOXICAM 15 MG TABLET: 15 | 30 days supply | Qty: 30 | Fill #1

## 2017-07-16 DIAGNOSIS — F4312 Post-traumatic stress disorder, chronic: Secondary | ICD-10-CM | POA: Diagnosis not present

## 2017-07-29 MED FILL — MELOXICAM 15 MG TABLET: 15 | 30 days supply | Qty: 30 | Fill #2

## 2017-08-02 MED FILL — SUMATRIPTAN SUCC 100 MG TAB: 100 | 30 days supply | Qty: 9 | Fill #6

## 2017-08-14 IMAGING — MR MR HIP*R* W/O CM
4 of 5 series · 19 of 40 positions shown · non-contrast
Comparison: None.

CLINICAL DATA: Stabbing right hip pain.  Unable to bear weight.

EXAM:
MR OF THE RIGHT HIP WITHOUT CONTRAST
TECHNIQUE: Multiplanar, multisequence MR imaging was performed. No intravenous
contrast was administered.

[Series 3: cor ir (pelvis) · coronal · 4.0mm · 0.78mm/px · 3 of 40 slices shown]
[im 5/40]
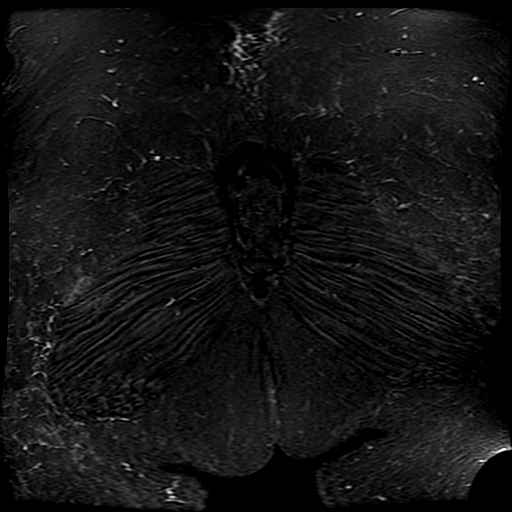
[im 20/40]
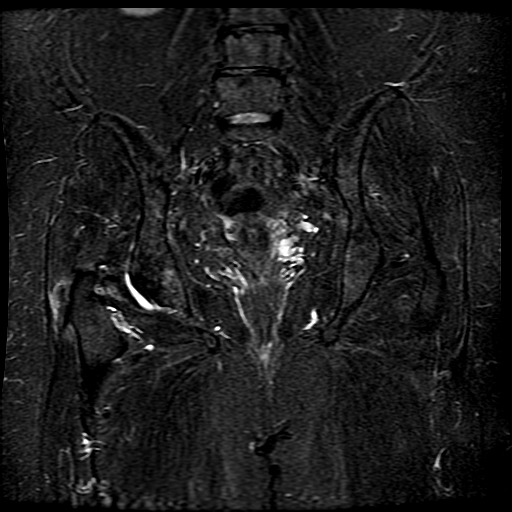
[im 35/40]
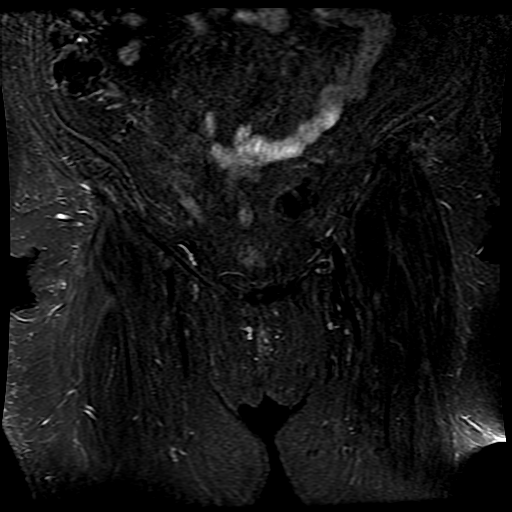

[Series 4: T1 · coronal · 4.0mm · 0.78mm/px · 8 of 40 slices shown (1 of 2)]
[im 1/40]
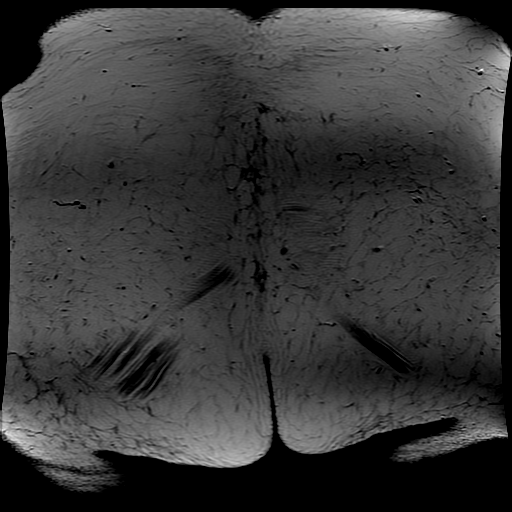
[im 6/40]
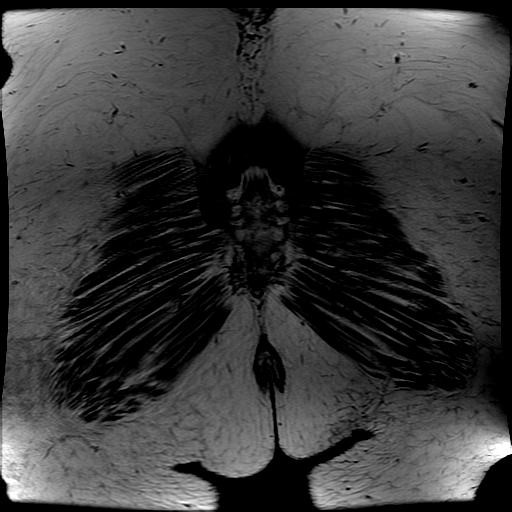
[im 12/40]
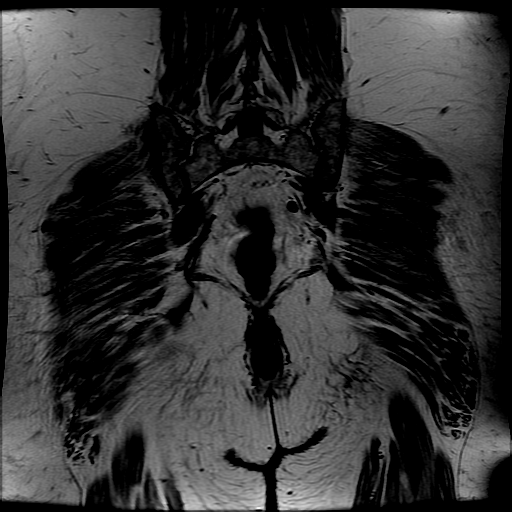
[im 17/40]
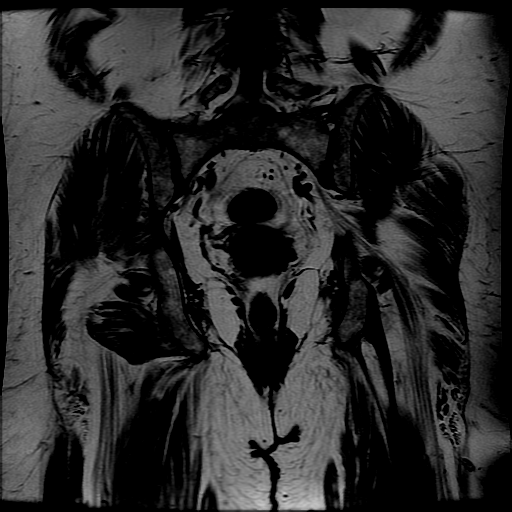
[im 23/40]
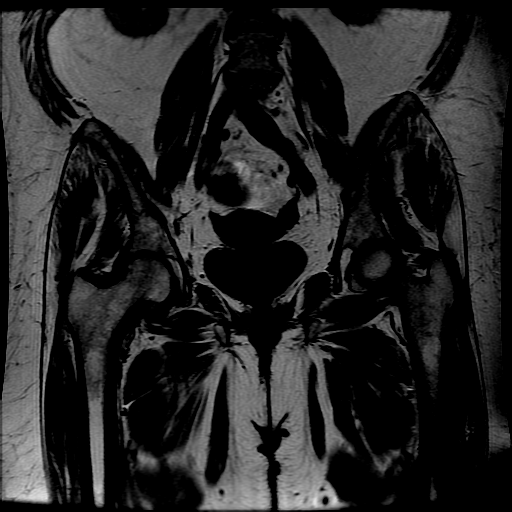
[im 28/40]
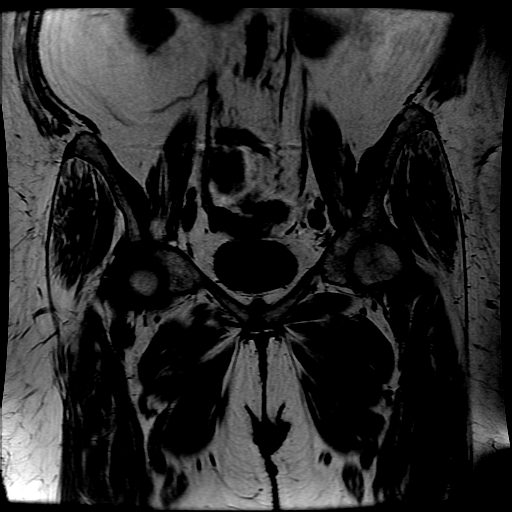
[im 34/40]
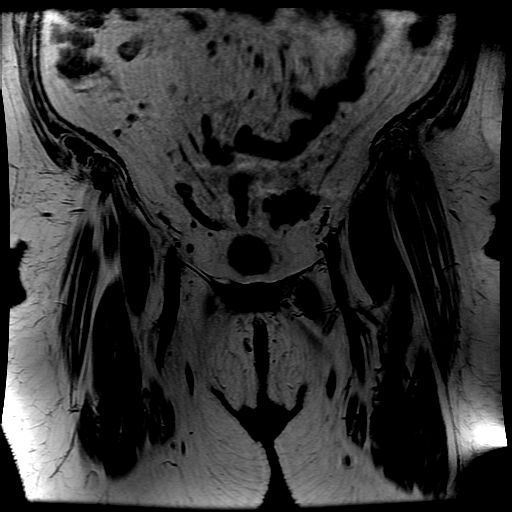
[im 40/40]
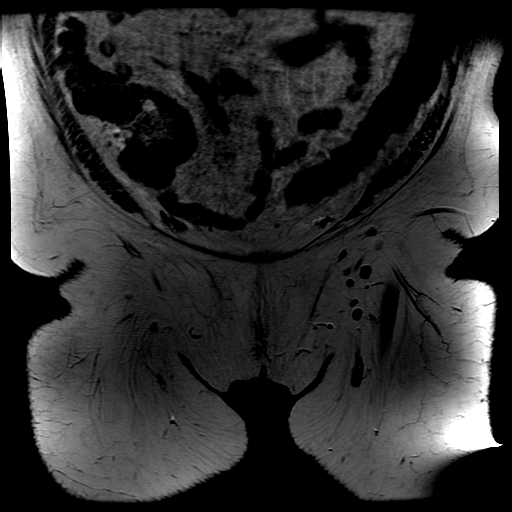

[Series 5: T1 · axial · 4.0mm · 0.74mm/px · z∈[+18,+182]mm · 3 of 45 slices shown (2 of 2)]
[im 6/45]
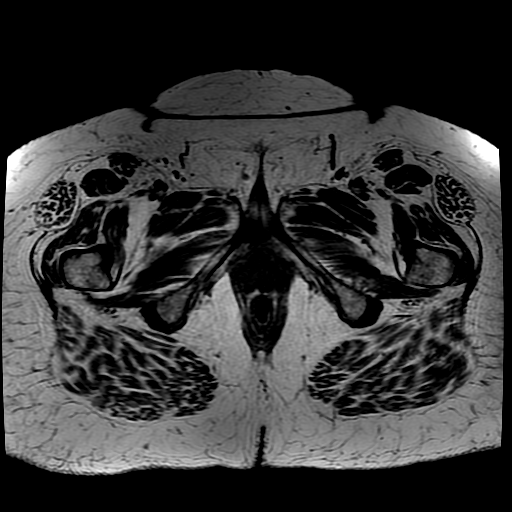
[im 23/45]
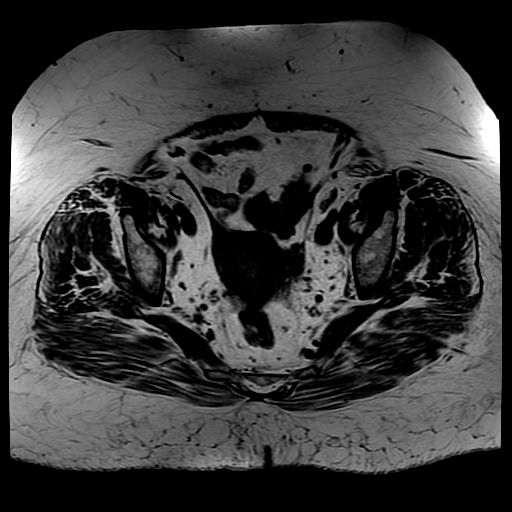
[im 39/45]
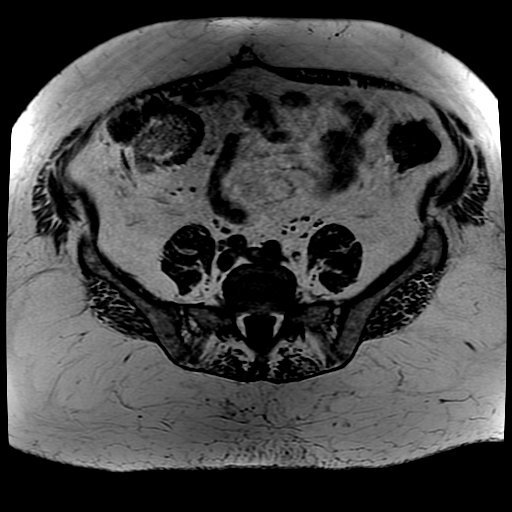

[Series 7: PD · sagittal · 4.0mm · 0.27mm/px · 5 of 27 slices shown]
[im 1/27]
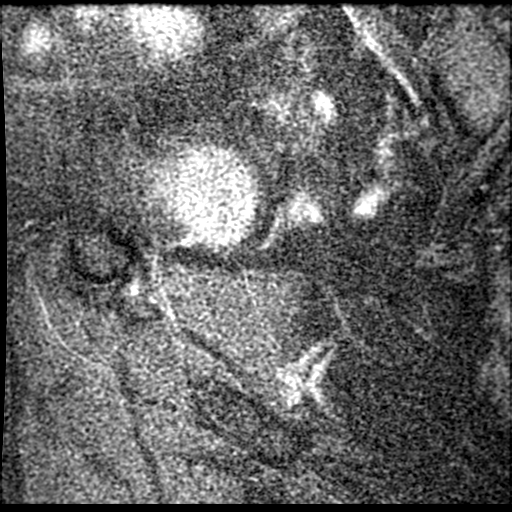
[im 7/27]
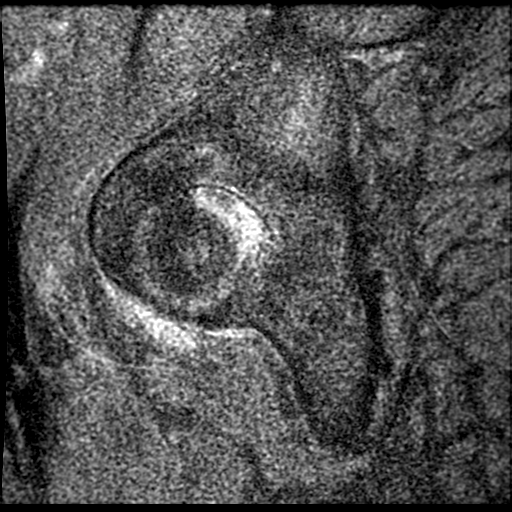
[im 14/27]
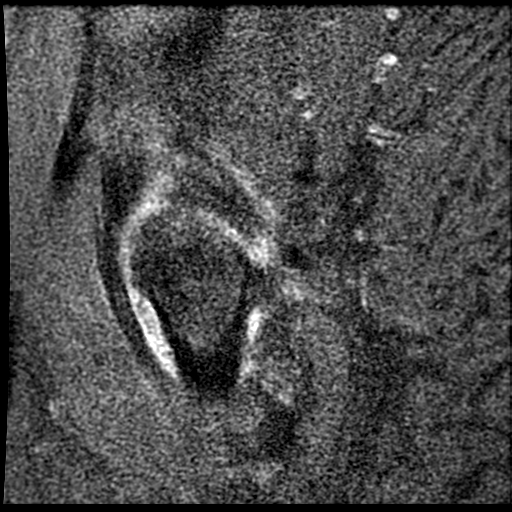
[im 20/27]
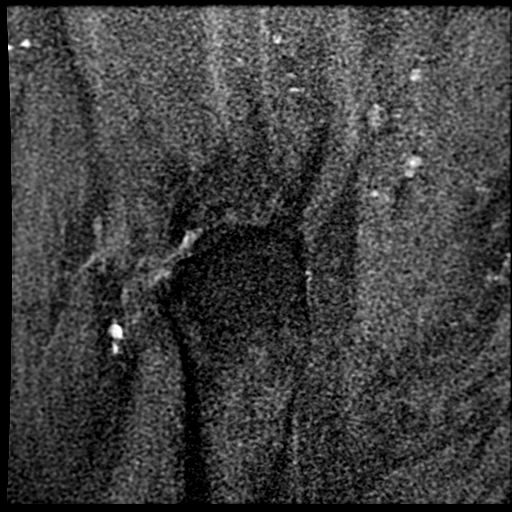
[im 27/27]
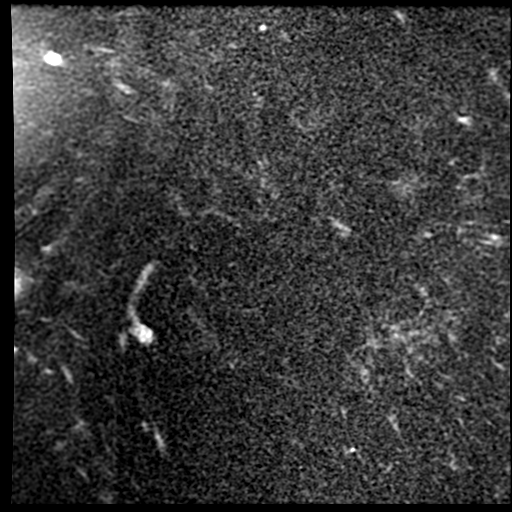

[19 of 40 positions shown; findings below may reference images not displayed]

FINDINGS: Bones: No hip fracture, dislocation or avascular necrosis. No
aggressive osseous lesion. No periosteal reaction or bone
destruction.

Mild degenerative changes of bilateral sacroiliac joints.

Degenerative disc disease with disc height loss at L3-4 and L4-5.

Articular cartilage and labrum

Articular cartilage: Partial-thickness cartilage loss of the right
hip.

Labrum: Limited evaluation secondary to large field of view and lack
of intra-articular contrast. Degeneration of the superior and
anterior right labrum.

Joint or bursal effusion

Joint effusion: Small right hip joint effusion. No left hip joint
effusion. No SI joint effusion.

Bursae:  No bursa formation.

Muscles and tendons

Flexors: Normal.

Extensors: Normal.

Abductors: Normal.

Adductors: Normal.

Rotators: Normal.

Hamstrings: Normal.

Other findings

Miscellaneous: No fluid collection or hematoma. No inguinal
lymphadenopathy. No inguinal hernia. No soft tissue mass. No pelvic
free fluid. Diverticulosis of the sigmoid colon without evidence of
diverticulitis. Mild soft tissue contusion overlying the right
greater trochanter.
IMPRESSION: 1. No hip fracture, dislocation or avascular necrosis.
2. Mild soft tissue contusion over the right greater trochanter.
3. Small right hip joint effusion.
4. Degeneration of the superior anterior right labrum.
5. Mild osteoarthritis of the right hip.

## 2017-09-01 MED FILL — OSCIMIN SL 0.125 MG TABLET: 0.125 | 30 days supply | Qty: 60 | Fill #0

## 2017-09-01 MED FILL — MELOXICAM 15 MG TABLET: 15 | 30 days supply | Qty: 30 | Fill #3

## 2017-09-02 MED FILL — SUMATRIPTAN SUCC 100 MG TAB: 100 | 30 days supply | Qty: 9 | Fill #7

## 2017-09-07 MED FILL — DEXILANT DR 60 MG CAPSULE: 60 | 30 days supply | Qty: 30 | Fill #0

## 2017-10-02 MED FILL — SUMATRIPTAN SUCC 100 MG TAB: 100 | 30 days supply | Qty: 9 | Fill #8

## 2017-10-05 MED FILL — MELOXICAM 15 MG TABLET: 15 | 30 days supply | Qty: 30 | Fill #0

## 2017-10-08 MED FILL — DEXILANT DR 60 MG CAPSULE: 60 | 30 days supply | Qty: 30 | Fill #1

## 2017-10-27 DIAGNOSIS — Z96652 Presence of left artificial knee joint: Secondary | ICD-10-CM | POA: Diagnosis not present

## 2017-10-27 DIAGNOSIS — M170 Bilateral primary osteoarthritis of knee: Secondary | ICD-10-CM | POA: Diagnosis not present

## 2017-10-27 DIAGNOSIS — G8929 Other chronic pain: Secondary | ICD-10-CM | POA: Diagnosis not present

## 2017-10-27 DIAGNOSIS — M25561 Pain in right knee: Secondary | ICD-10-CM | POA: Diagnosis not present

## 2017-10-27 DIAGNOSIS — Z471 Aftercare following joint replacement surgery: Secondary | ICD-10-CM | POA: Diagnosis not present

## 2017-11-02 MED FILL — DEXILANT DR 60 MG CAPSULE: 60 | 30 days supply | Qty: 30 | Fill #0

## 2017-11-02 MED FILL — SUMATRIPTAN SUCC 100 MG TAB: 100 | 30 days supply | Qty: 9 | Fill #9

## 2017-11-02 MED FILL — MELOXICAM 15 MG TABLET: 15 | 30 days supply | Qty: 30 | Fill #1

## 2017-12-06 MED FILL — DEXILANT DR 60 MG CAPSULE: 60 | 30 days supply | Qty: 30 | Fill #1

## 2017-12-06 MED FILL — MELOXICAM 15 MG TABLET: 15 | 30 days supply | Qty: 30 | Fill #2

## 2017-12-07 MED FILL — SUMATRIPTAN SUCC 100 MG TAB: 100 | 30 days supply | Qty: 18 | Fill #0

## 2017-12-07 MED FILL — CYCLOBENZAPRINE 10 MG TAB: 10 | 25 days supply | Qty: 50 | Fill #0

## 2018-01-05 MED FILL — DEXILANT DR 60 MG CAPSULE: 60 | 30 days supply | Qty: 30 | Fill #2

## 2018-01-06 MED FILL — MELOXICAM 15 MG TABLET: 15 | 30 days supply | Qty: 30 | Fill #0

## 2018-01-07 MED FILL — AIMOVIG 70 MG/ML SOAJ: 70 | 30 days supply | Qty: 1 | Fill #0

## 2018-02-17 MED FILL — SUMATRIPTAN SUCC 100 MG TAB: 100 | 30 days supply | Qty: 8 | Fill #1

## 2018-02-17 MED FILL — DEXILANT DR 60 MG CAPSULE: 60 | 30 days supply | Qty: 30 | Fill #3

## 2018-02-17 MED FILL — MELOXICAM 15 MG TABLET: 15 | 30 days supply | Qty: 30 | Fill #0

## 2018-02-18 DIAGNOSIS — R51 Headache: Secondary | ICD-10-CM | POA: Diagnosis not present

## 2018-02-18 DIAGNOSIS — E559 Vitamin D deficiency, unspecified: Secondary | ICD-10-CM | POA: Diagnosis not present

## 2018-02-18 DIAGNOSIS — K219 Gastro-esophageal reflux disease without esophagitis: Secondary | ICD-10-CM | POA: Diagnosis not present

## 2018-02-18 DIAGNOSIS — E7800 Pure hypercholesterolemia, unspecified: Secondary | ICD-10-CM | POA: Diagnosis not present

## 2018-03-18 MED FILL — SUMATRIPTAN SUCC 100 MG TAB: 100 | 30 days supply | Qty: 8 | Fill #2

## 2018-03-18 MED FILL — DEXILANT DR 60 MG CAPSULE: 60 | 30 days supply | Qty: 30 | Fill #4

## 2018-03-18 MED FILL — MELOXICAM 15 MG TABLET: 15 | 30 days supply | Qty: 30 | Fill #1

## 2018-03-22 DIAGNOSIS — F411 Generalized anxiety disorder: Secondary | ICD-10-CM | POA: Diagnosis not present

## 2018-03-29 MED FILL — AIMOVIG 140 DOSE 70 MG/ML S: 70 | 30 days supply | Qty: 2 | Fill #0

## 2018-04-07 MED FILL — OXYCODONE-APAP 10-325: 10-325 | 10 days supply | Qty: 60 | Fill #0

## 2018-04-08 DIAGNOSIS — J020 Streptococcal pharyngitis: Secondary | ICD-10-CM | POA: Diagnosis not present

## 2018-04-11 MED FILL — SUMATRIPTAN SUCC 100 MG TAB: 100 | 30 days supply | Qty: 8 | Fill #3

## 2018-04-14 MED FILL — OSCIMIN SL 0.125 MG TABLET: 0.125 | 30 days supply | Qty: 60 | Fill #0

## 2018-04-19 MED FILL — DEXILANT DR 60 MG CAPSULE: 60 | 30 days supply | Qty: 30 | Fill #5

## 2018-04-20 MED FILL — MELOXICAM 15 MG TABLET: 15 | 30 days supply | Qty: 30 | Fill #0

## 2018-04-28 DIAGNOSIS — M1711 Unilateral primary osteoarthritis, right knee: Secondary | ICD-10-CM | POA: Diagnosis not present

## 2018-04-28 DIAGNOSIS — Z96652 Presence of left artificial knee joint: Secondary | ICD-10-CM | POA: Diagnosis not present

## 2018-04-28 MED FILL — AIMOVIG 140 MG/ML SOAJ: 140 | 30 days supply | Qty: 1 | Fill #0

## 2018-05-03 MED FILL — AMOXICILLIN 875 MG TABLET: 875 | 10 days supply | Qty: 20 | Fill #0

## 2018-05-04 DIAGNOSIS — J029 Acute pharyngitis, unspecified: Secondary | ICD-10-CM | POA: Diagnosis not present

## 2018-05-04 DIAGNOSIS — R350 Frequency of micturition: Secondary | ICD-10-CM | POA: Diagnosis not present

## 2018-05-04 MED FILL — CYCLOBENZAPRINE 10 MG TAB: 10 | 25 days supply | Qty: 50 | Fill #0

## 2018-05-12 MED FILL — CHLORHEXIDINE 0.12% RINSE: 0.12 | 8 days supply | Qty: 473 | Fill #0

## 2018-05-12 MED FILL — traMADol HCL 50 MG TABS: 50 | 3 days supply | Qty: 10 | Fill #0

## 2018-05-12 MED FILL — AMOXICILLIN 500 MG CAPSULE: 500 | 11 days supply | Qty: 25 | Fill #0

## 2018-05-17 MED FILL — CEPHALEXIN 500 MG CAPSULE: 500 | 10 days supply | Qty: 20 | Fill #0

## 2018-05-18 MED FILL — MELOXICAM 15 MG TABLET: 15 | 30 days supply | Qty: 30 | Fill #1

## 2018-05-18 MED FILL — DEXILANT DR 60 MG CAPSULE: 60 | 30 days supply | Qty: 30 | Fill #0

## 2018-05-23 MED FILL — SUMATRIPTAN SUCC 100 MG TAB: 100 | 30 days supply | Qty: 8 | Fill #4

## 2018-05-24 MED FILL — CYCLOBENZAPRINE 10 MG TAB: 10 | 25 days supply | Qty: 50 | Fill #1

## 2018-05-24 MED FILL — AIMOVIG 140 MG/ML SOAJ: 140 | 30 days supply | Qty: 1 | Fill #1

## 2018-06-06 MED FILL — AZITHROMYCIN 500 MG TABLET: 500 | 3 days supply | Qty: 3 | Fill #0

## 2018-06-14 MED FILL — MELOXICAM 15 MG TABLET: 15 | 30 days supply | Qty: 30 | Fill #2

## 2018-06-14 MED FILL — DEXILANT DR 60 MG CAPSULE: 60 | 30 days supply | Qty: 30 | Fill #1

## 2018-06-28 MED FILL — AIMOVIG 140 MG/ML SOAJ: 140 | 30 days supply | Qty: 1 | Fill #2

## 2018-06-29 MED FILL — predniSONE 20 MG TABS: 20 | 9 days supply | Qty: 18 | Fill #0

## 2018-07-06 MED FILL — MYRBETRIQ ER 50 MG TABLET: 50 | 30 days supply | Qty: 30 | Fill #0

## 2018-07-08 DIAGNOSIS — F411 Generalized anxiety disorder: Secondary | ICD-10-CM | POA: Diagnosis not present

## 2018-07-08 MED FILL — DULoxetine HCL 30 MG CPEP: 30 | 19 days supply | Qty: 30 | Fill #0

## 2018-07-08 MED FILL — traZODone HCL 50 MG TABS: 50 | 30 days supply | Qty: 90 | Fill #0

## 2018-07-19 MED FILL — DEXILANT DR 60 MG CAPSULE: 60 | 30 days supply | Qty: 30 | Fill #2

## 2018-07-19 MED FILL — MELOXICAM 15 MG TABLET: 15 | 30 days supply | Qty: 30 | Fill #0

## 2018-07-19 MED FILL — SUMAtriptan SUCCINATE 100 M: 100 | 84 days supply | Qty: 24 | Fill #0

## 2018-07-29 ENCOUNTER — Other Ambulatory Visit: Payer: Self-pay | Admitting: Family Medicine

## 2018-07-29 DIAGNOSIS — Z1231 Encounter for screening mammogram for malignant neoplasm of breast: Secondary | ICD-10-CM

## 2018-07-29 MED FILL — AIMOVIG 140 MG/ML SOAJ: 140 | 30 days supply | Qty: 1 | Fill #3

## 2018-08-02 MED FILL — MYRBETRIQ ER 50 MG TABLET: 50 | 30 days supply | Qty: 30 | Fill #1

## 2018-08-02 MED FILL — DULoxetine HCL 60 MG CPEP: 60 | 30 days supply | Qty: 30 | Fill #0

## 2018-08-10 DIAGNOSIS — M1711 Unilateral primary osteoarthritis, right knee: Secondary | ICD-10-CM | POA: Diagnosis not present

## 2018-08-12 DIAGNOSIS — F411 Generalized anxiety disorder: Secondary | ICD-10-CM | POA: Diagnosis not present

## 2018-08-12 MED FILL — DEXILANT DR 60 MG CAPSULE: 60 | 30 days supply | Qty: 30 | Fill #3

## 2018-08-12 MED FILL — MELOXICAM 15 MG TABLET: 15 | 30 days supply | Qty: 30 | Fill #1

## 2018-08-26 ENCOUNTER — Ambulatory Visit
Admission: RE | Admit: 2018-08-26 | Discharge: 2018-08-26 | Disposition: A | Discharge status: Home / Self Care | Payer: 59 | Source: Ambulatory Visit | Attending: Family Medicine | Admitting: Family Medicine

## 2018-08-26 DIAGNOSIS — Z1231 Encounter for screening mammogram for malignant neoplasm of breast: Secondary | ICD-10-CM | POA: Diagnosis not present

## 2018-08-30 MED FILL — AIMOVIG 140 MG/ML SOAJ: 140 | 30 days supply | Qty: 1 | Fill #4

## 2018-08-31 MED FILL — OXYCODONE-APAP 10-325: 10-325 | 10 days supply | Qty: 60 | Fill #0

## 2018-09-07 MED FILL — MELOXICAM 15 MG TABLET: 15 | 30 days supply | Qty: 30 | Fill #0

## 2018-09-07 MED FILL — DULoxetine HCL 60 MG CPEP: 60 | 30 days supply | Qty: 30 | Fill #1

## 2018-09-07 MED FILL — DEXILANT DR 60 MG CAPSULE: 60 | 30 days supply | Qty: 30 | Fill #4

## 2018-09-22 ENCOUNTER — Ambulatory Visit (INDEPENDENT_AMBULATORY_CARE_PROVIDER_SITE_OTHER): Payer: 59 | Admitting: Psychiatry

## 2018-09-22 ENCOUNTER — Encounter: Payer: Self-pay | Admitting: Psychiatry

## 2018-09-22 DIAGNOSIS — F331 Major depressive disorder, recurrent, moderate: Secondary | ICD-10-CM

## 2018-09-22 MED ORDER — DULOXETINE HCL 30 MG PO CPEP: 90.0000 mg | ORAL_CAPSULE | Freq: Every day | ORAL | 1 refills | Status: DC

## 2018-09-22 NOTE — Progress Notes (Signed)
[facility] Check  Patient ID: Denise Patton,  MRN: [id number]  PCP: Shirline Frees, MD  Date of Evaluation: 09/22/2018 Time spent:15 minutes   HISTORY/CURRENT STATUS: HPI More struggles with depression.  Days of can' tmove.  Unmotivated, easy tears.  Good days and bad days. Wonders about increase in the dose.   Individual Medical History/ Review of Systems: Changes? :Yes arthritis, knee.  Allergies: Hydrocodone-acetaminophen  Current Medications:  Current Outpatient Medications:  .  cetirizine (ZYRTEC) 10 MG tablet, Take 10 mg by mouth daily., Disp: , Rfl:  .  dexlansoprazole (DEXILANT) 60 MG capsule, Take 60 mg by mouth daily with breakfast., Disp: , Rfl:  .  DULoxetine (CYMBALTA) 30 MG capsule, Take 3 capsules (90 mg total) by mouth daily., Disp: 90 capsule, Rfl: 1 .  hyoscyamine (LEVSIN, ANASPAZ) 0.125 MG tablet, Take 0.125 mg by mouth every 6 (six) hours as needed for cramping., Disp: , Rfl:  .  meloxicam (MOBIC) 15 MG tablet, Take 15 mg by mouth daily., Disp: , Rfl:  .  oxyCODONE 10 MG TABS, Take 1-2 tablets (10-20 mg total) by mouth every 3 (three) hours as needed., Disp: 90 tablet, Rfl: 0 .  Vitamin D, Ergocalciferol, (DRISDOL) 50000 units CAPS capsule, Take 50,000 Units by mouth every 7 (seven) days., Disp: , Rfl:  .  diphenhydrAMINE (BENADRYL) 25 MG tablet, Take 50 mg by mouth every 6 (six) hours as needed for itching., Disp: , Rfl:  .  methocarbamol (ROBAXIN) 500 MG tablet, Take 1 tablet (500 mg total) by mouth every 6 (six) hours as needed. (Patient not taking: Reported on 09/22/2018), Disp: 80 tablet, Rfl: 0 .  rivaroxaban (XARELTO) 10 MG TABS tablet, Take 1 tablet (10 mg total) by mouth daily with breakfast. Take Xarelto for two and a half more weeks, then discontinue Xarelto. (Patient not taking: Reported on 09/22/2018), Disp: 19 tablet, Rfl: 0 .  traZODone (DESYREL) 50 MG tablet, Take 50 mg by mouth at bedtime as needed., Disp: , Rfl:  Medication Side Effects:  None  Family Medical/ Social History: Changes? Yes knee pain  MENTAL HEALTH EXAM:  There were no vitals taken for this visit.There is no height or weight on file to calculate BMI.  General Appearance: Casual  Eye Contact:  Good  Speech:  Clear and Coherent  Volume:  Normal  Mood:  Depressed  Affect:  Congruent  Thought Process:  Goal Directed  Orientation:  Full (Time, Place, and Person)  Thought Content: WDL   Suicidal Thoughts:  No  Homicidal Thoughts:  No  Memory:  Recent  Judgement:  Good  Insight:  Good  Psychomotor Activity:  Normal  Concentration:  Concentration: Good  Recall:  Good  Fund of Knowledge: Good  Language: Good  Akathisia:  No  AIMS (if indicated): not done  Assets:  Financial Resources/Insurance Leisure Time Social Support Vocational/Educational  ADL's:  Intact  Cognition: WNL  Prognosis:  Good    DIAGNOSES:    ICD-10-CM   1. Major depressive disorder, recurrent episode, moderate (HCC) F33.1     RECOMMENDATIONS: increase duloxetine to 90 mg.  Discussed dosing range and options. Call if SE.  Discussed the nature of depression and its tendency to cause guilt.  She is feeling guilty for her depression.  This was discussed in some detail.  Discussed potential benefit of higher doses of duloxetine for pain.  She will follow-up in 5 weeks   Purnell Shoemaker, MD

## 2018-09-27 MED FILL — AIMOVIG 140 MG/ML SOAJ: 140 | 30 days supply | Qty: 1 | Fill #5

## 2018-09-28 MED FILL — DULoxetine HCL 30 MG CPEP: 30 | 30 days supply | Qty: 90 | Fill #0

## 2018-10-12 MED FILL — MYRBETRIQ ER 50 MG TABLET: 50 | 30 days supply | Qty: 30 | Fill #2

## 2018-10-12 MED FILL — DULoxetine HCL 60 MG CPEP: 60 | 30 days supply | Qty: 30 | Fill #0

## 2018-10-12 MED FILL — DEXILANT DR 60 MG CAPSULE: 60 | 30 days supply | Qty: 30 | Fill #0

## 2018-10-12 MED FILL — MELOXICAM 15 MG TABLET: 15 | 30 days supply | Qty: 30 | Fill #1

## 2018-10-21 DIAGNOSIS — G43909 Migraine, unspecified, not intractable, without status migrainosus: Secondary | ICD-10-CM | POA: Diagnosis not present

## 2018-10-21 DIAGNOSIS — R739 Hyperglycemia, unspecified: Secondary | ICD-10-CM | POA: Diagnosis not present

## 2018-10-21 DIAGNOSIS — Z23 Encounter for immunization: Secondary | ICD-10-CM | POA: Diagnosis not present

## 2018-10-21 DIAGNOSIS — K589 Irritable bowel syndrome without diarrhea: Secondary | ICD-10-CM | POA: Diagnosis not present

## 2018-10-21 DIAGNOSIS — K219 Gastro-esophageal reflux disease without esophagitis: Secondary | ICD-10-CM | POA: Diagnosis not present

## 2018-10-21 DIAGNOSIS — M2550 Pain in unspecified joint: Secondary | ICD-10-CM | POA: Diagnosis not present

## 2018-10-23 ENCOUNTER — Encounter: Payer: Self-pay | Admitting: Emergency Medicine

## 2018-10-23 DIAGNOSIS — F4310 Post-traumatic stress disorder, unspecified: Secondary | ICD-10-CM

## 2018-10-23 DIAGNOSIS — G4700 Insomnia, unspecified: Secondary | ICD-10-CM

## 2018-11-10 ENCOUNTER — Encounter: Payer: Self-pay | Admitting: Psychiatry

## 2018-11-10 ENCOUNTER — Ambulatory Visit: Payer: 59 | Admitting: Psychiatry

## 2018-11-10 DIAGNOSIS — F331 Major depressive disorder, recurrent, moderate: Secondary | ICD-10-CM

## 2018-11-10 MED ORDER — ARIPIPRAZOLE 5 MG PO TABS: 5.0000 mg | ORAL_TABLET | Freq: Every day | ORAL | 1 refills | Status: DC

## 2018-11-10 MED ORDER — DULOXETINE HCL 60 MG PO CPEP: 120.0000 mg | ORAL_CAPSULE | Freq: Every day | ORAL | 3 refills | Status: DC

## 2018-11-10 MED ORDER — DULOXETINE HCL 60 MG PO CPEP: 120.0000 mg | ORAL_CAPSULE | Freq: Every day | ORAL | 3 refills | Status: AC

## 2018-11-10 MED ORDER — ARIPIPRAZOLE 5 MG PO TABS: 5.0000 mg | ORAL_TABLET | Freq: Every day | ORAL | 1 refills | Status: AC

## 2018-11-10 MED FILL — DULoxetine HCL 60 MG CPEP: 60 | 30 days supply | Qty: 60 | Fill #0

## 2018-11-10 MED FILL — ARIPiprazole 5 MG TABS: 5 | 30 days supply | Qty: 30 | Fill #0

## 2018-11-10 NOTE — Progress Notes (Signed)
Denise Patton [id number] [birth year] 67 y.o.  Subjective:   Patient ID:  Denise Patton is a 67 y.o. (DOB July 27, 1951) female.  Chief Complaint:  Chief Complaint  Patient presents with  . Depression  . Follow-up    med changes    HPI Denise Patton presents to the office today for follow-up of increasing the duloxetine to 90 mg/D.  Tolerating it well without additional benefit. Anhedonia, poor productivity. Slow.  Just sits and stares. Isolates and may not leave the house for a week.  Tries to hide the depression from others.     Review of Systems:  Review of Systems  Musculoskeletal: Positive for arthralgias and gait problem.  Neurological: Negative for tremors and weakness.  Psychiatric/Behavioral: Positive for dysphoric mood. Negative for agitation, behavioral problems, confusion, decreased concentration, hallucinations, self-injury, sleep disturbance and suicidal ideas. The patient is not nervous/anxious and is not hyperactive.   A lot of knee pain.  Medications: I have reviewed the patient's current medications.  Current Outpatient Medications  Medication Sig Dispense Refill  . cetirizine (ZYRTEC) 10 MG tablet Take 10 mg by mouth daily.    Marland Kitchen dexlansoprazole (DEXILANT) 60 MG capsule Take 60 mg by mouth daily with breakfast.    . diphenhydrAMINE (BENADRYL) 25 MG tablet Take 50 mg by mouth every 6 (six) hours as needed for itching.    . DULoxetine (CYMBALTA) 60 MG capsule Take 2 capsules (120 mg total) by mouth daily. 60 capsule 3  . hyoscyamine (LEVSIN, ANASPAZ) 0.125 MG tablet Take 0.125 mg by mouth every 6 (six) hours as needed for cramping.    . meloxicam (MOBIC) 15 MG tablet Take 15 mg by mouth daily.    . methocarbamol (ROBAXIN) 500 MG tablet Take 1 tablet (500 mg total) by mouth every 6 (six) hours as needed. 80 tablet 0  . oxyCODONE 10 MG TABS Take 1-2 tablets (10-20 mg total) by mouth every 3 (three) hours as needed. 90 tablet 0  . rivaroxaban (XARELTO) 10 MG TABS tablet  Take 1 tablet (10 mg total) by mouth daily with breakfast. Take Xarelto for two and a half more weeks, then discontinue Xarelto. 19 tablet 0  . traZODone (DESYREL) 50 MG tablet Take 50 mg by mouth at bedtime as needed.    . Vitamin D, Ergocalciferol, (DRISDOL) 50000 units CAPS capsule Take 50,000 Units by mouth every 7 (seven) days.    . ARIPiprazole (ABILIFY) 5 MG tablet Take 1 tablet (5 mg total) by mouth daily. 30 tablet 1   No current facility-administered medications for this visit.     Medication Side Effects: None  Allergies:  Allergies  Allergen Reactions  . Hydrocodone-Acetaminophen     Past Medical History:  Diagnosis Date  . Chronic meniscal tear of knee 08-23-12   left knee meniscal tear  . GERD (gastroesophageal reflux disease) 08-23-12   reflux controlled with Dexilant  . Headache(784.0) 08-23-12   tx. migraines -with Botox inj. x3 months  . PTSD (post-traumatic stress disorder)   . URI (upper respiratory infection)    01/30/13- no fever- head congestion with greenish drainage- none now    History reviewed. No pertinent family history.  Social History   Socioeconomic History  . Marital status: Married    Spouse name: Not on file  . Number of children: Not on file  . Years of education: Not on file  . Highest education level: Not on file  Occupational History  . Not on file  Social Needs  .  Financial resource strain: Not on file  . Food insecurity:    Worry: Not on file    Inability: Not on file  . Transportation needs:    Medical: Not on file    Non-medical: Not on file  Tobacco Use  . Smoking status: Never Smoker  . Smokeless tobacco: Never Used  Substance and Sexual Activity  . Alcohol use: Yes    Alcohol/week: 1.0 standard drinks    Types: 1 Glasses of wine per week    Comment: wine glass nightly  . Drug use: No  . Sexual activity: Yes  Lifestyle  . Physical activity:    Days per week: Not on file    Minutes per session: Not on file  . Stress:  Not on file  Relationships  . Social connections:    Talks on phone: Not on file    Gets together: Not on file    Attends religious service: Not on file    Active member of club or organization: Not on file    Attends meetings of clubs or organizations: Not on file    Relationship status: Not on file  . Intimate partner violence:    Fear of current or ex partner: Not on file    Emotionally abused: Not on file    Physically abused: Not on file    Forced sexual activity: Not on file  Other Topics Concern  . Not on file  Social History Narrative  . Not on file    Past Medical History, Surgical history, Social history, and Family history were reviewed and updated as appropriate.   Please see review of systems for further details on the patient's review from today.   Objective:   Physical Exam:  There were no vitals taken for this visit.  Physical Exam  Lab Review:     Component Value Date/Time   NA 137 02/15/2013 0423   K 3.7 02/15/2013 0423   CL 101 02/15/2013 0423   CO2 28 02/15/2013 0423   GLUCOSE 153 (H) 02/15/2013 0423   BUN 10 02/15/2013 0423   CREATININE 0.54 02/15/2013 0423   CALCIUM 8.7 02/15/2013 0423   PROT 7.8 02/07/2013 1500   ALBUMIN 3.5 02/07/2013 1500   AST 14 02/07/2013 1500   ALT 17 02/07/2013 1500   ALKPHOS 104 02/07/2013 1500   BILITOT 0.2 (L) 02/07/2013 1500   GFRNONAA >90 02/15/2013 0423   GFRAA >90 02/15/2013 0423       Component Value Date/Time   WBC 10.1 02/15/2013 0423   RBC 3.21 (L) 02/15/2013 0423   HGB 11.0 (L) 02/15/2013 0423   HCT 32.9 (L) 02/15/2013 0423   PLT 214 02/15/2013 0423   MCV 102.5 (H) 02/15/2013 0423   MCH 34.3 (H) 02/15/2013 0423   MCHC 33.4 02/15/2013 0423   RDW 12.8 02/15/2013 0423    No results found for: POCLITH, LITHIUM   No results found for: PHENYTOIN, PHENOBARB, VALPROATE, CBMZ   .res Assessment: Plan:    Major depressive disorder, recurrent episode, moderate ([place])   She is tolerating the  duloxetine but her depression is not improved significantly.  We will maximize the dose.  Because we also facing the holidays and the stress of depression plus holidays is bearing down on her we will also augment with Abilify 5 mg.  Told her if she had any degree of tiredness then the reduce the dose to one half of 5 mg daily.  She has a potential to benefit from that within  a week.  Discussed potential metabolic side effects associated with atypical antipsychotics, as well as potential risk for movement side effects. Advised pt to contact office if movement side effects occur.  We may later try to stop the Abilify if this strategy is successful.  Follow-up 5 weeks  Lynder Parents MD, DFAPA  Please see After Visit Summary for patient specific instructions.  No future appointments.  No orders of the defined types were placed in this encounter.     -------------------------------

## 2018-11-11 MED FILL — DEXILANT DR 60 MG CAPSULE: 60 | 30 days supply | Qty: 30 | Fill #1

## 2018-11-11 MED FILL — MELOXICAM 15 MG TABLET: 15 | 30 days supply | Qty: 30 | Fill #2

## 2018-11-22 MED FILL — AJOVY 225 MG/1.5ML SOSY: 225 | 1 days supply | Qty: 2 | Fill #0

## 2018-11-22 MED FILL — MYRBETRIQ ER 50 MG TABLET: 50 | 30 days supply | Qty: 30 | Fill #3

## 2018-11-23 DIAGNOSIS — M1711 Unilateral primary osteoarthritis, right knee: Secondary | ICD-10-CM | POA: Diagnosis not present

## 2018-11-23 DIAGNOSIS — Z471 Aftercare following joint replacement surgery: Secondary | ICD-10-CM | POA: Diagnosis not present

## 2018-11-23 DIAGNOSIS — S8000XA Contusion of unspecified knee, initial encounter: Secondary | ICD-10-CM | POA: Diagnosis not present

## 2018-11-23 DIAGNOSIS — Z96652 Presence of left artificial knee joint: Secondary | ICD-10-CM | POA: Diagnosis not present

## 2018-11-30 DIAGNOSIS — M1711 Unilateral primary osteoarthritis, right knee: Secondary | ICD-10-CM | POA: Diagnosis not present

## 2018-12-07 DIAGNOSIS — M1711 Unilateral primary osteoarthritis, right knee: Secondary | ICD-10-CM | POA: Diagnosis not present

## 2018-12-16 MED FILL — DEXILANT DR 60 MG CAPSULE: 60 | 30 days supply | Qty: 30 | Fill #0

## 2018-12-19 MED FILL — MELOXICAM 15 MG TABLET: 15 | 30 days supply | Qty: 30 | Fill #0

## 2018-12-23 ENCOUNTER — Ambulatory Visit: Payer: 59 | Admitting: Psychiatry

## 2018-12-28 MED FILL — MYRBETRIQ ER 50 MG TABLET: 50 | 30 days supply | Qty: 30 | Fill #4

## 2018-12-28 MED FILL — AJOVY 225 MG/1.5ML SOSY: 225 | 30 days supply | Qty: 2 | Fill #1

## 2018-12-28 MED FILL — MELOXICAM 15 MG TABLET: 15 | 30 days supply | Qty: 30 | Fill #0

## 2019-01-11 MED FILL — SCOPOLAMINE 1 MG/3DAYS PT72: 1 | 12 days supply | Qty: 4 | Fill #0

## 2019-01-12 MED FILL — DULOXETINE HCL 60 MG CPEP: 60 | 30 days supply | Qty: 60 | Fill #1

## 2019-01-12 MED FILL — DEXILANT DR 60 MG CAPSULE: 60 | 30 days supply | Qty: 30 | Fill #1

## 2019-01-17 MED FILL — AMOXICILLIN 500 MG CAPSULE: 500 | 10 days supply | Qty: 40 | Fill #0

## 2019-01-17 MED FILL — TRIAMCINOLONE 0.5% CREAM: 0.5 | 14 days supply | Qty: 30 | Fill #0

## 2019-01-17 MED FILL — SUMAtriptan SUCCINATE 100 M: 100 | 42 days supply | Qty: 12 | Fill #1

## 2019-01-26 MED FILL — MELOXICAM 15 MG TABLET: 15 | 30 days supply | Qty: 30 | Fill #1

## 2019-01-27 ENCOUNTER — Other Ambulatory Visit: Payer: Self-pay | Admitting: Family Medicine

## 2019-01-27 ENCOUNTER — Ambulatory Visit
Admission: RE | Admit: 2019-01-27 | Discharge: 2019-01-27 | Disposition: A | Discharge status: Home / Self Care | Payer: 59 | Source: Ambulatory Visit | Attending: Family Medicine | Admitting: Family Medicine

## 2019-01-27 DIAGNOSIS — J9600 Acute respiratory failure, unspecified whether with hypoxia or hypercapnia: Secondary | ICD-10-CM | POA: Diagnosis not present

## 2019-01-27 DIAGNOSIS — G43909 Migraine, unspecified, not intractable, without status migrainosus: Secondary | ICD-10-CM | POA: Diagnosis not present

## 2019-01-27 DIAGNOSIS — R0989 Other specified symptoms and signs involving the circulatory and respiratory systems: Secondary | ICD-10-CM | POA: Diagnosis not present

## 2019-01-27 DIAGNOSIS — R0602 Shortness of breath: Secondary | ICD-10-CM

## 2019-01-27 MED FILL — FUROSEMIDE 20 MG TABS: 20 | 10 days supply | Qty: 10 | Fill #0

## 2019-01-27 MED FILL — levoFLOXacin 500 MG TABS: 500 | 10 days supply | Qty: 10 | Fill #0

## 2019-01-27 MED FILL — predniSONE 10 MG TABS: 10 | 8 days supply | Qty: 20 | Fill #0

## 2019-01-27 MED FILL — HYDROCODONE-HOMATROPINE SYR: 5-1.5 | 5 days supply | Qty: 100 | Fill #0

## 2019-01-27 MED FILL — VENTOLIN HFA 90 MCG INHALER: 108 (90 BAS | 30 days supply | Qty: 18 | Fill #0

## 2019-01-30 ENCOUNTER — Other Ambulatory Visit (HOSPITAL_COMMUNITY): Payer: Self-pay | Admitting: Family Medicine

## 2019-01-30 ENCOUNTER — Other Ambulatory Visit: Payer: Self-pay | Admitting: Family Medicine

## 2019-01-30 DIAGNOSIS — R0989 Other specified symptoms and signs involving the circulatory and respiratory systems: Secondary | ICD-10-CM

## 2019-02-02 ENCOUNTER — Other Ambulatory Visit (HOSPITAL_COMMUNITY): Payer: 59

## 2019-02-07 MED FILL — OXYCODONE-APAP 10-325: 10-325 | 10 days supply | Qty: 60 | Fill #0

## 2019-02-15 ENCOUNTER — Other Ambulatory Visit (HOSPITAL_COMMUNITY): Payer: 59

## 2019-02-20 MED FILL — MELOXICAM 15 MG TABLET: 15 | 30 days supply | Qty: 30 | Fill #2

## 2019-02-20 MED FILL — DEXILANT DR 60 MG CAPSULE: 60 | 30 days supply | Qty: 30 | Fill #2

## 2019-02-20 MED FILL — MYRBETRIQ ER 50 MG TABLET: 50 | 30 days supply | Qty: 30 | Fill #5

## 2019-02-23 MED FILL — ALPRAZolam 0.25 MG TABS: 0.25 | 2 days supply | Qty: 4 | Fill #0

## 2019-03-07 DIAGNOSIS — H1789 Other corneal scars and opacities: Secondary | ICD-10-CM | POA: Diagnosis not present

## 2019-03-07 DIAGNOSIS — H52223 Regular astigmatism, bilateral: Secondary | ICD-10-CM | POA: Diagnosis not present

## 2019-03-07 DIAGNOSIS — H5202 Hypermetropia, left eye: Secondary | ICD-10-CM | POA: Diagnosis not present

## 2019-03-07 DIAGNOSIS — H5211 Myopia, right eye: Secondary | ICD-10-CM | POA: Diagnosis not present

## 2019-03-07 DIAGNOSIS — H2589 Other age-related cataract: Secondary | ICD-10-CM | POA: Diagnosis not present

## 2019-03-07 DIAGNOSIS — H1045 Other chronic allergic conjunctivitis: Secondary | ICD-10-CM | POA: Diagnosis not present

## 2019-03-20 MED FILL — MYRBETRIQ ER 50 MG TABLET: 50 | 30 days supply | Qty: 30 | Fill #0

## 2019-03-20 MED FILL — MELOXICAM 15 MG TABLET: 15 | 30 days supply | Qty: 30 | Fill #0

## 2019-03-27 MED FILL — DEXILANT DR 60 MG CAPSULE: 60 | 30 days supply | Qty: 30 | Fill #3

## 2019-03-30 MED FILL — DEXILANT DR 60 MG CAPSULE: 60 | 30 days supply | Qty: 30 | Fill #4

## 2019-04-05 DIAGNOSIS — G43909 Migraine, unspecified, not intractable, without status migrainosus: Secondary | ICD-10-CM | POA: Diagnosis not present

## 2019-04-10 MED FILL — NURTEC 75 MG TBDP: 75 | 30 days supply | Qty: 8 | Fill #0

## 2019-04-10 MED FILL — MYRBETRIQ ER 50 MG TABLET: 50 | 30 days supply | Qty: 30 | Fill #1

## 2019-04-20 DIAGNOSIS — E7800 Pure hypercholesterolemia, unspecified: Secondary | ICD-10-CM | POA: Diagnosis not present

## 2019-04-20 DIAGNOSIS — G43909 Migraine, unspecified, not intractable, without status migrainosus: Secondary | ICD-10-CM | POA: Diagnosis not present

## 2019-04-20 DIAGNOSIS — F419 Anxiety disorder, unspecified: Secondary | ICD-10-CM | POA: Diagnosis not present

## 2019-04-20 DIAGNOSIS — M25562 Pain in left knee: Secondary | ICD-10-CM | POA: Diagnosis not present

## 2019-04-20 DIAGNOSIS — M25561 Pain in right knee: Secondary | ICD-10-CM | POA: Diagnosis not present

## 2019-04-20 DIAGNOSIS — R7303 Prediabetes: Secondary | ICD-10-CM | POA: Diagnosis not present

## 2019-04-20 DIAGNOSIS — K219 Gastro-esophageal reflux disease without esophagitis: Secondary | ICD-10-CM | POA: Diagnosis not present

## 2019-04-24 MED FILL — DEXILANT DR 60 MG CAPSULE: 60 | 30 days supply | Qty: 30 | Fill #5

## 2019-04-24 MED FILL — MELOXICAM 15 MG TABLET: 15 | 30 days supply | Qty: 30 | Fill #1

## 2019-05-04 MED FILL — MYRBETRIQ ER 50 MG TABLET: 50 | 30 days supply | Qty: 30 | Fill #2

## 2019-05-05 MED FILL — AMOXICILLIN 875 MG TABS: 875 | 10 days supply | Qty: 20 | Fill #0

## 2019-05-09 DIAGNOSIS — E7800 Pure hypercholesterolemia, unspecified: Secondary | ICD-10-CM | POA: Diagnosis not present

## 2019-05-09 DIAGNOSIS — R7303 Prediabetes: Secondary | ICD-10-CM | POA: Diagnosis not present

## 2019-05-10 MED FILL — NURTEC 75 MG TBDP: 75 | 30 days supply | Qty: 8 | Fill #1

## 2019-05-22 MED FILL — DEXILANT DR 60 MG CAPSULE: 60 | 30 days supply | Qty: 30 | Fill #6

## 2019-05-22 MED FILL — MELOXICAM 15 MG TABLET: 15 | 30 days supply | Qty: 30 | Fill #0

## 2019-06-06 MED FILL — MYRBETRIQ ER 50 MG TABLET: 50 | 30 days supply | Qty: 30 | Fill #0

## 2019-06-19 MED FILL — OSCIMIN SL 0.125 MG TABLET: 0.125 | 10 days supply | Qty: 60 | Fill #0

## 2019-06-19 MED FILL — DEXILANT DR 60 MG CAPSULE: 60 | 30 days supply | Qty: 30 | Fill #7

## 2019-06-26 MED FILL — MELOXICAM 15 MG TABLET: 15 | 30 days supply | Qty: 30 | Fill #1

## 2019-06-27 MED FILL — SUMATRIPTAN SUCC 100 MG TAB: 100 | 60 days supply | Qty: 18 | Fill #0

## 2019-07-10 MED FILL — MYRBETRIQ ER 50 MG TABLET: 50 | 30 days supply | Qty: 30 | Fill #1

## 2019-07-11 MED FILL — SUMATRIPTAN SUCC 100 MG TAB: 100 | 60 days supply | Qty: 18 | Fill #0

## 2019-07-14 MED FILL — NURTEC 75 MG TBDP: 75 | 30 days supply | Qty: 8 | Fill #0

## 2019-07-21 ENCOUNTER — Other Ambulatory Visit (HOSPITAL_COMMUNITY): Payer: Self-pay | Admitting: Family Medicine

## 2019-07-21 DIAGNOSIS — R0989 Other specified symptoms and signs involving the circulatory and respiratory systems: Secondary | ICD-10-CM

## 2019-07-24 MED FILL — DEXILANT DR 60 MG CAPSULE: 60 | 30 days supply | Qty: 30 | Fill #8

## 2019-07-26 ENCOUNTER — Ambulatory Visit (HOSPITAL_COMMUNITY): Payer: 59 | Attending: Internal Medicine

## 2019-07-26 ENCOUNTER — Other Ambulatory Visit: Payer: Self-pay

## 2019-07-26 DIAGNOSIS — R0989 Other specified symptoms and signs involving the circulatory and respiratory systems: Secondary | ICD-10-CM | POA: Diagnosis not present

## 2019-07-26 MED ORDER — PERFLUTREN LIPID MICROSPHERE
1.0000 mL | INTRAVENOUS | Status: AC | PRN
  Administered 2019-07-26: 2 mL via INTRAVENOUS

## 2019-07-31 MED FILL — MELOXICAM 15 MG TABLET: 15 | 30 days supply | Qty: 30 | Fill #2

## 2019-08-07 DIAGNOSIS — F4312 Post-traumatic stress disorder, chronic: Secondary | ICD-10-CM | POA: Diagnosis not present

## 2019-08-07 MED FILL — MYRBETRIQ ER 50 MG TABLET: 50 | 30 days supply | Qty: 30 | Fill #2

## 2019-08-21 MED FILL — DEXILANT DR 60 MG CAPSULE: 60 | 30 days supply | Qty: 30 | Fill #9

## 2019-08-29 MED FILL — MELOXICAM 15 MG TABLET: 15 | 30 days supply | Qty: 30 | Fill #3

## 2019-09-04 DIAGNOSIS — M1711 Unilateral primary osteoarthritis, right knee: Secondary | ICD-10-CM | POA: Diagnosis not present

## 2019-09-05 MED FILL — MYRBETRIQ ER 50 MG TABLET: 50 | 30 days supply | Qty: 30 | Fill #3

## 2019-09-11 DIAGNOSIS — M25561 Pain in right knee: Secondary | ICD-10-CM | POA: Diagnosis not present

## 2019-09-11 DIAGNOSIS — M1711 Unilateral primary osteoarthritis, right knee: Secondary | ICD-10-CM | POA: Diagnosis not present

## 2019-09-18 DIAGNOSIS — M1711 Unilateral primary osteoarthritis, right knee: Secondary | ICD-10-CM | POA: Diagnosis not present

## 2019-09-21 DIAGNOSIS — H5202 Hypermetropia, left eye: Secondary | ICD-10-CM | POA: Diagnosis not present

## 2019-09-21 DIAGNOSIS — H52223 Regular astigmatism, bilateral: Secondary | ICD-10-CM | POA: Diagnosis not present

## 2019-09-21 DIAGNOSIS — H25813 Combined forms of age-related cataract, bilateral: Secondary | ICD-10-CM | POA: Diagnosis not present

## 2019-09-21 DIAGNOSIS — H524 Presbyopia: Secondary | ICD-10-CM | POA: Diagnosis not present

## 2019-09-21 DIAGNOSIS — H5211 Myopia, right eye: Secondary | ICD-10-CM | POA: Diagnosis not present

## 2019-09-25 MED FILL — DEXILANT DR 60 MG CAPSULE: 60 | 30 days supply | Qty: 30 | Fill #10

## 2019-09-25 MED FILL — MELOXICAM 15 MG TABLET: 15 | 30 days supply | Qty: 30 | Fill #4

## 2019-10-04 DIAGNOSIS — H2513 Age-related nuclear cataract, bilateral: Secondary | ICD-10-CM | POA: Diagnosis not present

## 2019-10-04 DIAGNOSIS — H25013 Cortical age-related cataract, bilateral: Secondary | ICD-10-CM | POA: Diagnosis not present

## 2019-10-04 DIAGNOSIS — H16221 Keratoconjunctivitis sicca, not specified as Sjogren's, right eye: Secondary | ICD-10-CM | POA: Diagnosis not present

## 2019-10-04 DIAGNOSIS — H18413 Arcus senilis, bilateral: Secondary | ICD-10-CM | POA: Diagnosis not present

## 2019-10-04 DIAGNOSIS — H16222 Keratoconjunctivitis sicca, not specified as Sjogren's, left eye: Secondary | ICD-10-CM | POA: Diagnosis not present

## 2019-10-04 DIAGNOSIS — H25043 Posterior subcapsular polar age-related cataract, bilateral: Secondary | ICD-10-CM | POA: Diagnosis not present

## 2019-10-04 DIAGNOSIS — H16223 Keratoconjunctivitis sicca, not specified as Sjogren's, bilateral: Secondary | ICD-10-CM | POA: Diagnosis not present

## 2019-10-04 DIAGNOSIS — H2511 Age-related nuclear cataract, right eye: Secondary | ICD-10-CM | POA: Diagnosis not present

## 2019-10-06 MED FILL — MYRBETRIQ ER 50 MG TABLET: 50 | 30 days supply | Qty: 30 | Fill #4

## 2019-10-19 MED FILL — ALPRAZolam 0.25 MG TABS: 0.25 | 7 days supply | Qty: 15 | Fill #0

## 2019-10-27 MED FILL — MELOXICAM 15 MG TABLET: 15 | 30 days supply | Qty: 30 | Fill #5

## 2019-10-27 MED FILL — DEXILANT DR 60 MG CAPSULE: 60 | 30 days supply | Qty: 30 | Fill #11

## 2019-10-30 DIAGNOSIS — H18413 Arcus senilis, bilateral: Secondary | ICD-10-CM | POA: Diagnosis not present

## 2019-10-30 DIAGNOSIS — H25043 Posterior subcapsular polar age-related cataract, bilateral: Secondary | ICD-10-CM | POA: Diagnosis not present

## 2019-10-30 DIAGNOSIS — H25013 Cortical age-related cataract, bilateral: Secondary | ICD-10-CM | POA: Diagnosis not present

## 2019-10-30 DIAGNOSIS — H2513 Age-related nuclear cataract, bilateral: Secondary | ICD-10-CM | POA: Diagnosis not present

## 2019-10-31 DIAGNOSIS — H2511 Age-related nuclear cataract, right eye: Secondary | ICD-10-CM | POA: Diagnosis not present

## 2019-10-31 DIAGNOSIS — H25811 Combined forms of age-related cataract, right eye: Secondary | ICD-10-CM | POA: Diagnosis not present

## 2019-10-31 DIAGNOSIS — Z961 Presence of intraocular lens: Secondary | ICD-10-CM | POA: Diagnosis not present

## 2019-11-02 MED FILL — BESIVANCE 0.6% SUSP: 0.6 | 25 days supply | Qty: 5 | Fill #0

## 2019-11-02 MED FILL — PROLENSA 0.07% EYE DROPS: 0.07 | 23 days supply | Qty: 3 | Fill #0

## 2019-11-02 MED FILL — DUREZOL 0.05% EYE DROPS: 0.05 | 25 days supply | Qty: 5 | Fill #0

## 2019-11-07 DIAGNOSIS — H2512 Age-related nuclear cataract, left eye: Secondary | ICD-10-CM | POA: Diagnosis not present

## 2019-11-07 DIAGNOSIS — H25812 Combined forms of age-related cataract, left eye: Secondary | ICD-10-CM | POA: Diagnosis not present

## 2019-11-09 MED FILL — MYRBETRIQ ER 50 MG TABLET: 50 | 30 days supply | Qty: 30 | Fill #5

## 2019-11-24 MED FILL — MELOXICAM 15 MG TABLET: 15 | 30 days supply | Qty: 30 | Fill #0

## 2019-11-24 MED FILL — DEXILANT DR 60 MG CAPSULE: 60 | 30 days supply | Qty: 30 | Fill #0

## 2019-11-29 MED FILL — TIMOLOL 0.5% EYE DROPS: 0.5 | 25 days supply | Qty: 5 | Fill #0

## 2019-12-06 MED FILL — OSCIMIN SL 0.125 MG TABLET: 0.125 | 10 days supply | Qty: 60 | Fill #0

## 2019-12-07 MED FILL — MYRBETRIQ ER 50 MG TABLET: 50 | 30 days supply | Qty: 30 | Fill #6

## 2019-12-07 MED FILL — NURTEC 75 MG TBDP: 75 | 30 days supply | Qty: 8 | Fill #2

## 2019-12-21 MED FILL — OXYCODONE-APAP 10-325: 10-325 | 10 days supply | Qty: 60 | Fill #0

## 2019-12-28 MED FILL — DEXILANT DR 60 MG CAPSULE: 60 | 30 days supply | Qty: 30 | Fill #1

## 2019-12-28 MED FILL — MELOXICAM 15 MG TABLET: 15 | 30 days supply | Qty: 30 | Fill #1

## 2020-01-15 DIAGNOSIS — Z6841 Body Mass Index (BMI) 40.0 and over, adult: Secondary | ICD-10-CM | POA: Diagnosis not present

## 2020-01-22 MED FILL — MELOXICAM 15 MG TABLET: 15 | 30 days supply | Qty: 30 | Fill #2

## 2020-01-22 MED FILL — NURTEC 75 MG TBDP: 75 | 30 days supply | Qty: 8 | Fill #3

## 2020-01-22 MED FILL — MYRBETRIQ ER 50 MG TABLET: 50 | 30 days supply | Qty: 30 | Fill #7

## 2020-01-24 MED FILL — DEXILANT DR 60 MG CAPSULE: 60 | 30 days supply | Qty: 30 | Fill #2

## 2020-02-14 MED FILL — AJOVY 225 MG/1.5ML SOSY: 225 | 30 days supply | Qty: 2 | Fill #0

## 2020-02-20 MED FILL — MELOXICAM 15 MG TABLET: 15 | 30 days supply | Qty: 30 | Fill #0

## 2020-02-20 MED FILL — EMGALITY 120 MG/ML SOAJ: 120 | 30 days supply | Qty: 1 | Fill #0

## 2020-02-20 MED FILL — MYRBETRIQ ER 50 MG TABLET: 50 | 30 days supply | Qty: 30 | Fill #8

## 2020-02-21 MED FILL — DEXILANT DR 60 MG CAPSULE: 60 | 30 days supply | Qty: 30 | Fill #3

## 2020-03-06 DIAGNOSIS — H5212 Myopia, left eye: Secondary | ICD-10-CM | POA: Diagnosis not present

## 2020-03-06 DIAGNOSIS — Z9849 Cataract extraction status, unspecified eye: Secondary | ICD-10-CM | POA: Diagnosis not present

## 2020-03-06 DIAGNOSIS — H52223 Regular astigmatism, bilateral: Secondary | ICD-10-CM | POA: Diagnosis not present

## 2020-03-06 DIAGNOSIS — H524 Presbyopia: Secondary | ICD-10-CM | POA: Diagnosis not present

## 2020-03-06 DIAGNOSIS — Z961 Presence of intraocular lens: Secondary | ICD-10-CM | POA: Diagnosis not present

## 2020-03-06 MED FILL — RESTASIS 0.05% EYE EMULSION: 0.05 | 90 days supply | Qty: 180 | Fill #0

## 2020-03-06 MED FILL — FLUOROMETHOLONE 0.1% DROPS: 0.1 | 12 days supply | Qty: 5 | Fill #0

## 2020-03-26 MED FILL — EMGALITY 120 MG/ML SOAJ: 120 | 30 days supply | Qty: 1 | Fill #1

## 2020-03-26 MED FILL — MYRBETRIQ ER 50 MG TABLET: 50 | 30 days supply | Qty: 30 | Fill #9

## 2020-03-26 MED FILL — DEXILANT DR 60 MG CAPSULE: 60 | 30 days supply | Qty: 30 | Fill #0

## 2020-03-26 MED FILL — MELOXICAM 15 MG TABLET: 15 | 30 days supply | Qty: 30 | Fill #1

## 2020-04-19 MED FILL — MELOXICAM 15 MG TABLET: 15 | 30 days supply | Qty: 30 | Fill #2

## 2020-04-19 MED FILL — DEXILANT DR 60 MG CAPSULE: 60 | 30 days supply | Qty: 30 | Fill #1

## 2020-04-19 MED FILL — MYRBETRIQ ER 50 MG TABLET: 50 | 30 days supply | Qty: 30 | Fill #10

## 2020-05-30 ENCOUNTER — Ambulatory Visit
Admission: RE | Admit: 2020-05-30 | Discharge: 2020-05-30 | Disposition: A | Discharge status: Home / Self Care | Payer: 59 | Source: Ambulatory Visit | Attending: Physician Assistant | Admitting: Physician Assistant

## 2020-05-30 ENCOUNTER — Other Ambulatory Visit: Payer: Self-pay

## 2020-05-30 ENCOUNTER — Other Ambulatory Visit: Payer: Self-pay | Admitting: Physician Assistant

## 2020-05-30 DIAGNOSIS — S99921A Unspecified injury of right foot, initial encounter: Secondary | ICD-10-CM

## 2020-05-30 DIAGNOSIS — M79674 Pain in right toe(s): Secondary | ICD-10-CM | POA: Diagnosis not present

## 2020-06-10 ENCOUNTER — Other Ambulatory Visit (HOSPITAL_COMMUNITY): Payer: Self-pay | Admitting: Family Medicine

## 2020-06-27 ENCOUNTER — Other Ambulatory Visit (HOSPITAL_COMMUNITY): Payer: Self-pay | Admitting: Family Medicine

## 2020-06-27 MED FILL — DEXILANT DR 60 MG CAPSULE: 60 | 30 days supply | Qty: 30 | Fill #0

## 2020-06-27 MED FILL — MYRBETRIQ ER 50 MG TABLET: 50 | 30 days supply | Qty: 30 | Fill #0

## 2020-06-27 MED FILL — MELOXICAM 15 MG TABLET: 15 | 30 days supply | Qty: 30 | Fill #0

## 2020-07-01 MED FILL — NURTEC 75 MG TBDP: 75 | 30 days supply | Qty: 8 | Fill #4

## 2020-07-02 DIAGNOSIS — M791 Myalgia, unspecified site: Secondary | ICD-10-CM | POA: Diagnosis not present

## 2020-07-02 DIAGNOSIS — M25541 Pain in joints of right hand: Secondary | ICD-10-CM | POA: Diagnosis not present

## 2020-07-02 DIAGNOSIS — M25542 Pain in joints of left hand: Secondary | ICD-10-CM | POA: Diagnosis not present

## 2020-07-23 MED FILL — MELOXICAM 15 MG TABLET: 15 | 30 days supply | Qty: 30 | Fill #1

## 2020-07-23 MED FILL — MYRBETRIQ ER 50 MG TABLET: 50 | 30 days supply | Qty: 30 | Fill #1

## 2020-07-23 MED FILL — DEXILANT DR 60 MG CAPSULE: 60 | 30 days supply | Qty: 30 | Fill #1

## 2020-07-30 ENCOUNTER — Other Ambulatory Visit: Payer: Self-pay | Admitting: Family Medicine

## 2020-07-31 ENCOUNTER — Other Ambulatory Visit: Payer: Self-pay | Admitting: Family Medicine

## 2020-07-31 DIAGNOSIS — E2839 Other primary ovarian failure: Secondary | ICD-10-CM

## 2020-07-31 DIAGNOSIS — Z1231 Encounter for screening mammogram for malignant neoplasm of breast: Secondary | ICD-10-CM

## 2020-08-05 MED FILL — ONDANSETRON HCL 8 MG TABLET: 8 | 10 days supply | Qty: 30 | Fill #0

## 2020-08-06 ENCOUNTER — Other Ambulatory Visit: Payer: 59

## 2020-08-06 DIAGNOSIS — M1990 Unspecified osteoarthritis, unspecified site: Secondary | ICD-10-CM | POA: Diagnosis not present

## 2020-08-06 DIAGNOSIS — D8989 Other specified disorders involving the immune mechanism, not elsewhere classified: Secondary | ICD-10-CM | POA: Diagnosis not present

## 2020-08-06 DIAGNOSIS — M2550 Pain in unspecified joint: Secondary | ICD-10-CM | POA: Diagnosis not present

## 2020-08-06 DIAGNOSIS — M25741 Osteophyte, right hand: Secondary | ICD-10-CM | POA: Diagnosis not present

## 2020-08-06 DIAGNOSIS — M79642 Pain in left hand: Secondary | ICD-10-CM | POA: Diagnosis not present

## 2020-08-06 DIAGNOSIS — M160 Bilateral primary osteoarthritis of hip: Secondary | ICD-10-CM | POA: Diagnosis not present

## 2020-08-06 DIAGNOSIS — M7989 Other specified soft tissue disorders: Secondary | ICD-10-CM | POA: Diagnosis not present

## 2020-08-06 DIAGNOSIS — Z13820 Encounter for screening for osteoporosis: Secondary | ICD-10-CM | POA: Diagnosis not present

## 2020-08-06 DIAGNOSIS — M79646 Pain in unspecified finger(s): Secondary | ICD-10-CM | POA: Diagnosis not present

## 2020-08-06 DIAGNOSIS — M19041 Primary osteoarthritis, right hand: Secondary | ICD-10-CM | POA: Diagnosis not present

## 2020-08-06 DIAGNOSIS — H04129 Dry eye syndrome of unspecified lacrimal gland: Secondary | ICD-10-CM | POA: Diagnosis not present

## 2020-08-06 DIAGNOSIS — M8589 Other specified disorders of bone density and structure, multiple sites: Secondary | ICD-10-CM | POA: Diagnosis not present

## 2020-08-06 DIAGNOSIS — M533 Sacrococcygeal disorders, not elsewhere classified: Secondary | ICD-10-CM | POA: Diagnosis not present

## 2020-08-06 DIAGNOSIS — M79641 Pain in right hand: Secondary | ICD-10-CM | POA: Diagnosis not present

## 2020-08-06 DIAGNOSIS — M549 Dorsalgia, unspecified: Secondary | ICD-10-CM | POA: Diagnosis not present

## 2020-08-06 DIAGNOSIS — M19031 Primary osteoarthritis, right wrist: Secondary | ICD-10-CM | POA: Diagnosis not present

## 2020-08-06 DIAGNOSIS — M064 Inflammatory polyarthropathy: Secondary | ICD-10-CM | POA: Diagnosis not present

## 2020-08-06 DIAGNOSIS — M47818 Spondylosis without myelopathy or radiculopathy, sacral and sacrococcygeal region: Secondary | ICD-10-CM | POA: Diagnosis not present

## 2020-08-06 DIAGNOSIS — M19042 Primary osteoarthritis, left hand: Secondary | ICD-10-CM | POA: Diagnosis not present

## 2020-08-06 DIAGNOSIS — M545 Low back pain: Secondary | ICD-10-CM | POA: Diagnosis not present

## 2020-08-06 MED FILL — predniSONE 5 MG TABS: 5 | 12 days supply | Qty: 42 | Fill #0

## 2020-08-09 DIAGNOSIS — S92911A Unspecified fracture of right toe(s), initial encounter for closed fracture: Secondary | ICD-10-CM | POA: Diagnosis not present

## 2020-08-09 DIAGNOSIS — M79671 Pain in right foot: Secondary | ICD-10-CM | POA: Diagnosis not present

## 2020-08-13 MED FILL — ALPRAZolam 0.25 MG TABS: 0.25 | 1 days supply | Qty: 8 | Fill #0

## 2020-08-15 ENCOUNTER — Ambulatory Visit
Admission: RE | Admit: 2020-08-15 | Discharge: 2020-08-15 | Disposition: A | Discharge status: Home / Self Care | Payer: 59 | Source: Ambulatory Visit | Attending: Family Medicine | Admitting: Family Medicine

## 2020-08-15 ENCOUNTER — Other Ambulatory Visit: Payer: Self-pay

## 2020-08-15 DIAGNOSIS — Z1231 Encounter for screening mammogram for malignant neoplasm of breast: Secondary | ICD-10-CM

## 2020-08-21 DIAGNOSIS — M8580 Other specified disorders of bone density and structure, unspecified site: Secondary | ICD-10-CM | POA: Diagnosis not present

## 2020-08-21 DIAGNOSIS — M1990 Unspecified osteoarthritis, unspecified site: Secondary | ICD-10-CM | POA: Diagnosis not present

## 2020-08-21 DIAGNOSIS — R768 Other specified abnormal immunological findings in serum: Secondary | ICD-10-CM | POA: Diagnosis not present

## 2020-08-21 DIAGNOSIS — H04129 Dry eye syndrome of unspecified lacrimal gland: Secondary | ICD-10-CM | POA: Diagnosis not present

## 2020-08-21 DIAGNOSIS — M79646 Pain in unspecified finger(s): Secondary | ICD-10-CM | POA: Diagnosis not present

## 2020-08-21 DIAGNOSIS — M79676 Pain in unspecified toe(s): Secondary | ICD-10-CM | POA: Diagnosis not present

## 2020-08-24 ENCOUNTER — Other Ambulatory Visit (HOSPITAL_COMMUNITY): Payer: Self-pay | Admitting: Family Medicine

## 2020-08-24 MED FILL — NURTEC 75 MG TBDP: 75 | 30 days supply | Qty: 8 | Fill #0

## 2020-08-27 MED FILL — MELOXICAM 15 MG TABLET: 15 | 30 days supply | Qty: 30 | Fill #2

## 2020-08-27 MED FILL — DEXILANT DR 60 MG CAPSULE: 60 | 30 days supply | Qty: 30 | Fill #2

## 2020-08-27 MED FILL — MYRBETRIQ ER 50 MG TABLET: 50 | 30 days supply | Qty: 30 | Fill #2

## 2020-08-28 DIAGNOSIS — M79671 Pain in right foot: Secondary | ICD-10-CM | POA: Diagnosis not present

## 2020-09-02 MED FILL — OSCIMIN 0.125 MG SUBL: 0.125 | 10 days supply | Qty: 60 | Fill #1

## 2020-09-04 DIAGNOSIS — G5761 Lesion of plantar nerve, right lower limb: Secondary | ICD-10-CM | POA: Diagnosis not present

## 2020-09-20 MED FILL — NURTEC 75 MG TBDP: 75 | 30 days supply | Qty: 8 | Fill #1

## 2020-09-26 MED FILL — MYRBETRIQ ER 50 MG TABLET: 50 | 30 days supply | Qty: 30 | Fill #3

## 2020-09-26 MED FILL — MELOXICAM 15 MG TABLET: 15 | 30 days supply | Qty: 30 | Fill #3

## 2020-09-29 ENCOUNTER — Other Ambulatory Visit (HOSPITAL_COMMUNITY): Payer: Self-pay | Admitting: Family Medicine

## 2020-09-30 MED FILL — DEXILANT DR 60 MG CAPSULE: 60 | 30 days supply | Qty: 30 | Fill #0

## 2020-10-21 MED FILL — NURTEC 75 MG TBDP: 75 | 30 days supply | Qty: 8 | Fill #2

## 2020-10-23 ENCOUNTER — Other Ambulatory Visit (HOSPITAL_COMMUNITY): Payer: Self-pay | Admitting: Family Medicine

## 2020-10-23 MED FILL — MELOXICAM 15 MG TABLET: 15 | 30 days supply | Qty: 30 | Fill #0

## 2020-10-23 MED FILL — MYRBETRIQ ER 50 MG TABLET: 50 | 30 days supply | Qty: 30 | Fill #4

## 2020-10-24 MED FILL — DEXILANT DR 60 MG CAPSULE: 60 | 30 days supply | Qty: 30 | Fill #1

## 2020-10-28 DIAGNOSIS — R768 Other specified abnormal immunological findings in serum: Secondary | ICD-10-CM | POA: Diagnosis not present

## 2020-10-28 DIAGNOSIS — M79646 Pain in unspecified finger(s): Secondary | ICD-10-CM | POA: Diagnosis not present

## 2020-10-28 DIAGNOSIS — M8580 Other specified disorders of bone density and structure, unspecified site: Secondary | ICD-10-CM | POA: Diagnosis not present

## 2020-10-28 DIAGNOSIS — H04129 Dry eye syndrome of unspecified lacrimal gland: Secondary | ICD-10-CM | POA: Diagnosis not present

## 2020-10-28 DIAGNOSIS — M1990 Unspecified osteoarthritis, unspecified site: Secondary | ICD-10-CM | POA: Diagnosis not present

## 2020-10-28 DIAGNOSIS — M79676 Pain in unspecified toe(s): Secondary | ICD-10-CM | POA: Diagnosis not present

## 2020-11-19 MED FILL — NURTEC 75 MG TBDP: 75 | 30 days supply | Qty: 8 | Fill #3

## 2020-11-26 MED FILL — MELOXICAM 15 MG TABLET: 15 | 30 days supply | Qty: 30 | Fill #1

## 2020-11-26 MED FILL — DEXILANT DR 60 MG CAPSULE: 60 | 30 days supply | Qty: 30 | Fill #2

## 2020-11-26 MED FILL — MYRBETRIQ ER 50 MG TABLET: 50 | 30 days supply | Qty: 30 | Fill #5

## 2020-12-25 ENCOUNTER — Other Ambulatory Visit (HOSPITAL_COMMUNITY): Payer: Self-pay | Admitting: Family Medicine

## 2020-12-25 MED FILL — MYRBETRIQ ER 50 MG TABLET: 50 | 30 days supply | Qty: 30 | Fill #6

## 2020-12-25 MED FILL — MELOXICAM 15 MG TABLET: 15 | 30 days supply | Qty: 30 | Fill #2

## 2020-12-25 MED FILL — DEXILANT DR 60 MG CAPSULE: 60 | 30 days supply | Qty: 30 | Fill #0

## 2020-12-25 MED FILL — NURTEC 75 MG TBDP: 75 | 30 days supply | Qty: 8 | Fill #4

## 2021-01-03 ENCOUNTER — Other Ambulatory Visit (HOSPITAL_COMMUNITY): Payer: Self-pay | Admitting: Family Medicine

## 2021-01-03 DIAGNOSIS — R059 Cough, unspecified: Secondary | ICD-10-CM | POA: Diagnosis not present

## 2021-01-03 DIAGNOSIS — U071 COVID-19: Secondary | ICD-10-CM | POA: Diagnosis not present

## 2021-01-03 MED FILL — HYDROCODONE-HOMATROPINE SYR: 5-1.5 | 5 days supply | Qty: 100 | Fill #0

## 2021-01-03 MED FILL — AZITHROMYCIN 250 MG TABS: 250 | 6 days supply | Qty: 6 | Fill #0

## 2021-01-23 ENCOUNTER — Other Ambulatory Visit (HOSPITAL_COMMUNITY): Payer: Self-pay | Admitting: Family Medicine

## 2021-01-23 MED FILL — NURTEC 75 MG TBDP: 75 | 30 days supply | Qty: 8 | Fill #5

## 2021-01-23 MED FILL — DEXLANSOPRAZOLE 60 MG CPDR: 60 | 30 days supply | Qty: 30 | Fill #1

## 2021-01-23 MED FILL — MELOXICAM 15 MG TABLET: 15 | 30 days supply | Qty: 30 | Fill #3

## 2021-01-23 MED FILL — MYRBETRIQ ER 50 MG TABLET: 50 | 30 days supply | Qty: 30 | Fill #0

## 2021-01-31 ENCOUNTER — Other Ambulatory Visit: Payer: Self-pay | Admitting: Family Medicine

## 2021-01-31 ENCOUNTER — Other Ambulatory Visit (HOSPITAL_COMMUNITY): Payer: Self-pay | Admitting: Family Medicine

## 2021-01-31 ENCOUNTER — Ambulatory Visit
Admission: RE | Admit: 2021-01-31 | Discharge: 2021-01-31 | Disposition: A | Discharge status: Home / Self Care | Payer: 59 | Source: Ambulatory Visit | Attending: Family Medicine | Admitting: Family Medicine

## 2021-01-31 DIAGNOSIS — R109 Unspecified abdominal pain: Secondary | ICD-10-CM

## 2021-01-31 DIAGNOSIS — K5730 Diverticulosis of large intestine without perforation or abscess without bleeding: Secondary | ICD-10-CM | POA: Diagnosis not present

## 2021-01-31 DIAGNOSIS — R1084 Generalized abdominal pain: Secondary | ICD-10-CM | POA: Diagnosis not present

## 2021-01-31 MED ORDER — IOPAMIDOL (ISOVUE-300) INJECTION 61%
100.0000 mL | Freq: Once | INTRAVENOUS | Status: AC | PRN
  Administered 2021-01-31: 100 mL via INTRAVENOUS

## 2021-01-31 MED FILL — OXYCODONE-APAP 10-325: 10-325 | 10 days supply | Qty: 60 | Fill #0

## 2021-01-31 MED FILL — METRONIDAZOLE 500 MG TABS: 500 | 10 days supply | Qty: 20 | Fill #0

## 2021-01-31 MED FILL — CIPROFLOXACIN HCL 500 MG TA: 500 | 10 days supply | Qty: 20 | Fill #0

## 2021-02-03 ENCOUNTER — Emergency Department (HOSPITAL_COMMUNITY)
Admission: EM | Admit: 2021-02-03 | Discharge: 2021-02-03 | Disposition: A | Discharge status: Home / Self Care | Payer: 59 | Attending: Emergency Medicine | Admitting: Emergency Medicine

## 2021-02-03 ENCOUNTER — Encounter (HOSPITAL_COMMUNITY): Payer: Self-pay | Admitting: *Deleted

## 2021-02-03 ENCOUNTER — Other Ambulatory Visit: Payer: Self-pay

## 2021-02-03 DIAGNOSIS — R109 Unspecified abdominal pain: Secondary | ICD-10-CM | POA: Insufficient documentation

## 2021-02-03 DIAGNOSIS — I10 Essential (primary) hypertension: Secondary | ICD-10-CM | POA: Diagnosis not present

## 2021-02-03 DIAGNOSIS — R52 Pain, unspecified: Secondary | ICD-10-CM | POA: Diagnosis not present

## 2021-02-03 DIAGNOSIS — R1084 Generalized abdominal pain: Secondary | ICD-10-CM | POA: Diagnosis not present

## 2021-02-03 DIAGNOSIS — Z5321 Procedure and treatment not carried out due to patient leaving prior to being seen by health care provider: Secondary | ICD-10-CM | POA: Insufficient documentation

## 2021-02-03 HISTORY — DX: Migraine, unspecified, not intractable, without status migrainosus: G43.909

## 2021-02-03 LAB — CBC
HCT: 44.2 % (ref 36.0–46.0)
Hemoglobin: 15 g/dL (ref 12.0–15.0)
MCH: 36.1 pg — ABNORMAL HIGH (ref 26.0–34.0)
MCHC: 33.9 g/dL (ref 30.0–36.0)
MCV: 106.5 fL — ABNORMAL HIGH (ref 80.0–100.0)
Platelets: 246 10*3/uL (ref 150–400)
RBC: 4.15 MIL/uL (ref 3.87–5.11)
RDW: 11.2 % — ABNORMAL LOW (ref 11.5–15.5)
WBC: 9.3 10*3/uL (ref 4.0–10.5)
nRBC: 0 % (ref 0.0–0.2)

## 2021-02-03 LAB — COMPREHENSIVE METABOLIC PANEL
ALT: 21 U/L (ref 0–44)
AST: 32 U/L (ref 15–41)
Albumin: 3.3 g/dL — ABNORMAL LOW (ref 3.5–5.0)
Alkaline Phosphatase: 66 U/L (ref 38–126)
Anion gap: 11 (ref 5–15)
BUN: 7 mg/dL — ABNORMAL LOW (ref 8–23)
CO2: 27 mmol/L (ref 22–32)
Calcium: 9.5 mg/dL (ref 8.9–10.3)
Chloride: 100 mmol/L (ref 98–111)
Creatinine, Ser: 0.72 mg/dL (ref 0.44–1.00)
GFR, Estimated: >60 mL/min (ref >60)
Glucose, Bld: 108 mg/dL — ABNORMAL HIGH (ref 70–99)
Potassium: 5.2 mmol/L — ABNORMAL HIGH (ref 3.5–5.1)
Sodium: 138 mmol/L (ref 135–145)
Total Bilirubin: 1.2 mg/dL (ref 0.3–1.2)
Total Protein: 6.6 g/dL (ref 6.5–8.1)

## 2021-02-03 LAB — LIPASE, BLOOD: Lipase: 24 U/L (ref 11–51)

## 2021-02-03 NOTE — ED Triage Notes (Signed)
Pt arrived by [facility] from home. Had abd pain and went to pcp last week and started on oxycodone on Thursday. Last bm was Thursday but states she felt like possible constipation and only passed liquid around it. No bowel movement since that time. Has been taking colace with her pain meds and feels like abd is distended, had more severe pain this am. Not eating or drinking x 2 days.

## 2021-02-03 NOTE — ED Notes (Signed)
Patient family stated that they had called GI, and would follow up with them.

## 2021-02-05 DIAGNOSIS — K529 Noninfective gastroenteritis and colitis, unspecified: Secondary | ICD-10-CM | POA: Diagnosis not present

## 2021-02-05 DIAGNOSIS — Z86010 Personal history of colonic polyps: Secondary | ICD-10-CM | POA: Diagnosis not present

## 2021-02-05 DIAGNOSIS — R933 Abnormal findings on diagnostic imaging of other parts of digestive tract: Secondary | ICD-10-CM | POA: Diagnosis not present

## 2021-02-05 DIAGNOSIS — K921 Melena: Secondary | ICD-10-CM | POA: Diagnosis not present

## 2021-02-07 DIAGNOSIS — K921 Melena: Secondary | ICD-10-CM | POA: Diagnosis not present

## 2021-02-07 DIAGNOSIS — D120 Benign neoplasm of cecum: Secondary | ICD-10-CM | POA: Diagnosis not present

## 2021-02-07 DIAGNOSIS — K6389 Other specified diseases of intestine: Secondary | ICD-10-CM | POA: Diagnosis not present

## 2021-02-07 DIAGNOSIS — K635 Polyp of colon: Secondary | ICD-10-CM | POA: Diagnosis not present

## 2021-02-07 DIAGNOSIS — D122 Benign neoplasm of ascending colon: Secondary | ICD-10-CM | POA: Diagnosis not present

## 2021-02-07 DIAGNOSIS — D123 Benign neoplasm of transverse colon: Secondary | ICD-10-CM | POA: Diagnosis not present

## 2021-02-07 DIAGNOSIS — D124 Benign neoplasm of descending colon: Secondary | ICD-10-CM | POA: Diagnosis not present

## 2021-02-07 DIAGNOSIS — R933 Abnormal findings on diagnostic imaging of other parts of digestive tract: Secondary | ICD-10-CM | POA: Diagnosis not present

## 2021-02-25 ENCOUNTER — Other Ambulatory Visit (HOSPITAL_COMMUNITY): Payer: Self-pay | Admitting: Family Medicine

## 2021-02-25 MED FILL — DEXLANSOPRAZOLE 60 MG CPDR: 60 | 30 days supply | Qty: 30 | Fill #2

## 2021-02-25 MED FILL — NURTEC 75 MG TBDP: 75 | 30 days supply | Qty: 8 | Fill #6

## 2021-02-25 MED FILL — MELOXICAM 15 MG TABLET: 15 | 30 days supply | Qty: 30 | Fill #0

## 2021-02-25 MED FILL — MYRBETRIQ ER 50 MG TABLET: 50 | 30 days supply | Qty: 30 | Fill #1

## 2021-03-14 ENCOUNTER — Other Ambulatory Visit (HOSPITAL_BASED_OUTPATIENT_CLINIC_OR_DEPARTMENT_OTHER): Payer: Self-pay

## 2021-03-25 ENCOUNTER — Other Ambulatory Visit (HOSPITAL_COMMUNITY): Payer: Self-pay

## 2021-03-25 MED ORDER — DEXLANSOPRAZOLE 60 MG PO CPDR
DELAYED_RELEASE_CAPSULE | ORAL | 3 refills | Status: DC
  Filled 2021-03-25: qty 30, 30d supply, fill #0
  Filled 2021-04-22: qty 30, 30d supply, fill #1
  Filled 2021-05-21: qty 30, 30d supply, fill #2
  Filled 2021-06-25: qty 30, 30d supply, fill #3

## 2021-03-25 MED FILL — Mirabegron Tab ER 24 HR 50 MG: ORAL | 30 days supply | Qty: 30 | Fill #0 | Status: AC

## 2021-03-25 MED FILL — Meloxicam Tab 15 MG: ORAL | 30 days supply | Qty: 30 | Fill #0 | Status: AC

## 2021-03-25 MED FILL — Rimegepant Sulfate Tab Disint 75 MG: ORAL | 30 days supply | Qty: 8 | Fill #0 | Status: AC

## 2021-03-26 ENCOUNTER — Other Ambulatory Visit (HOSPITAL_COMMUNITY): Payer: Self-pay

## 2021-03-27 ENCOUNTER — Other Ambulatory Visit (HOSPITAL_COMMUNITY): Payer: Self-pay

## 2021-04-17 ENCOUNTER — Other Ambulatory Visit (HOSPITAL_COMMUNITY): Payer: Self-pay

## 2021-04-22 ENCOUNTER — Other Ambulatory Visit (HOSPITAL_COMMUNITY): Payer: Self-pay

## 2021-04-22 MED FILL — Mirabegron Tab ER 24 HR 50 MG: ORAL | 30 days supply | Qty: 30 | Fill #1 | Status: AC

## 2021-04-28 DIAGNOSIS — M79644 Pain in right finger(s): Secondary | ICD-10-CM | POA: Diagnosis not present

## 2021-04-28 DIAGNOSIS — M8580 Other specified disorders of bone density and structure, unspecified site: Secondary | ICD-10-CM | POA: Diagnosis not present

## 2021-04-28 DIAGNOSIS — M2550 Pain in unspecified joint: Secondary | ICD-10-CM | POA: Diagnosis not present

## 2021-04-28 DIAGNOSIS — R768 Other specified abnormal immunological findings in serum: Secondary | ICD-10-CM | POA: Diagnosis not present

## 2021-04-28 DIAGNOSIS — M1990 Unspecified osteoarthritis, unspecified site: Secondary | ICD-10-CM | POA: Diagnosis not present

## 2021-04-28 DIAGNOSIS — H04129 Dry eye syndrome of unspecified lacrimal gland: Secondary | ICD-10-CM | POA: Diagnosis not present

## 2021-04-28 DIAGNOSIS — M79645 Pain in left finger(s): Secondary | ICD-10-CM | POA: Diagnosis not present

## 2021-04-30 ENCOUNTER — Other Ambulatory Visit (HOSPITAL_COMMUNITY): Payer: Self-pay

## 2021-04-30 MED ORDER — MELOXICAM 15 MG PO TABS
ORAL_TABLET | ORAL | 2 refills | Status: DC
  Filled 2021-04-30: qty 30, 30d supply, fill #0
  Filled 2021-06-03: qty 30, 30d supply, fill #1
  Filled 2021-07-07: qty 30, 30d supply, fill #2

## 2021-05-20 ENCOUNTER — Other Ambulatory Visit (HOSPITAL_COMMUNITY): Payer: Self-pay

## 2021-05-20 MED ORDER — ALPRAZOLAM 0.25 MG PO TABS
ORAL_TABLET | ORAL | 0 refills | Status: AC
  Filled 2021-05-20: qty 15, 7d supply, fill #0

## 2021-05-21 ENCOUNTER — Other Ambulatory Visit (HOSPITAL_COMMUNITY): Payer: Self-pay

## 2021-05-21 MED FILL — Mirabegron Tab ER 24 HR 50 MG: ORAL | 30 days supply | Qty: 30 | Fill #2 | Status: AC

## 2021-06-03 ENCOUNTER — Other Ambulatory Visit (HOSPITAL_COMMUNITY): Payer: Self-pay

## 2021-06-11 ENCOUNTER — Other Ambulatory Visit (HOSPITAL_COMMUNITY): Payer: Self-pay

## 2021-06-11 MED ORDER — HYDROCOD POLST-CPM POLST ER 10-8 MG/5ML PO SUER
ORAL | 0 refills | Status: AC
  Filled 2021-06-11: qty 50, 5d supply, fill #0

## 2021-06-11 MED ORDER — AZITHROMYCIN 250 MG PO TABS
ORAL_TABLET | ORAL | 0 refills | Status: AC
  Filled 2021-06-11: qty 6, 5d supply, fill #0

## 2021-06-11 MED ORDER — BENZONATATE 200 MG PO CAPS
ORAL_CAPSULE | ORAL | 2 refills | Status: AC
  Filled 2021-06-11: qty 60, 20d supply, fill #0

## 2021-06-12 ENCOUNTER — Other Ambulatory Visit (HOSPITAL_COMMUNITY): Payer: Self-pay

## 2021-06-12 MED ORDER — ALBUTEROL SULFATE HFA 108 (90 BASE) MCG/ACT IN AERS
INHALATION_SPRAY | RESPIRATORY_TRACT | 1 refills | Status: AC
  Filled 2021-06-12: qty 18, 25d supply, fill #0

## 2021-06-25 ENCOUNTER — Other Ambulatory Visit (HOSPITAL_COMMUNITY): Payer: Self-pay

## 2021-06-25 MED FILL — Mirabegron Tab ER 24 HR 50 MG: ORAL | 30 days supply | Qty: 30 | Fill #3 | Status: CN

## 2021-06-26 ENCOUNTER — Other Ambulatory Visit (HOSPITAL_COMMUNITY): Payer: Self-pay

## 2021-06-26 MED FILL — Mirabegron Tab ER 24 HR 50 MG: ORAL | 30 days supply | Qty: 30 | Fill #3 | Status: AC

## 2021-07-03 ENCOUNTER — Other Ambulatory Visit (HOSPITAL_COMMUNITY): Payer: Self-pay

## 2021-07-03 MED FILL — Rimegepant Sulfate Tab Disint 75 MG: ORAL | 30 days supply | Qty: 8 | Fill #1 | Status: AC

## 2021-07-07 ENCOUNTER — Other Ambulatory Visit (HOSPITAL_COMMUNITY): Payer: Self-pay

## 2021-07-23 ENCOUNTER — Other Ambulatory Visit (HOSPITAL_COMMUNITY): Payer: Self-pay

## 2021-07-23 MED FILL — Mirabegron Tab ER 24 HR 50 MG: ORAL | 30 days supply | Qty: 30 | Fill #4 | Status: AC

## 2021-07-24 ENCOUNTER — Other Ambulatory Visit (HOSPITAL_COMMUNITY): Payer: Self-pay

## 2021-07-24 MED ORDER — DEXLANSOPRAZOLE 60 MG PO CPDR
60.0000 mg | DELAYED_RELEASE_CAPSULE | Freq: Every morning | ORAL | 1 refills | Status: DC
  Filled 2021-07-24: qty 30, 30d supply, fill #0
  Filled 2021-08-26: qty 30, 30d supply, fill #1

## 2021-08-07 ENCOUNTER — Other Ambulatory Visit (HOSPITAL_COMMUNITY): Payer: Self-pay

## 2021-08-08 ENCOUNTER — Other Ambulatory Visit (HOSPITAL_COMMUNITY): Payer: Self-pay

## 2021-08-08 MED ORDER — MELOXICAM 15 MG PO TABS
ORAL_TABLET | ORAL | 2 refills | Status: DC
  Filled 2021-08-08: qty 30, 30d supply, fill #0
  Filled 2021-09-02: qty 30, 30d supply, fill #1
  Filled 2021-09-30: qty 30, 30d supply, fill #2

## 2021-08-26 ENCOUNTER — Other Ambulatory Visit (HOSPITAL_COMMUNITY): Payer: Self-pay

## 2021-08-26 MED ORDER — MYRBETRIQ 50 MG PO TB24
ORAL_TABLET | ORAL | 1 refills | Status: DC
  Filled 2021-08-26: qty 30, 30d supply, fill #0
  Filled 2021-09-22: qty 30, 30d supply, fill #1

## 2021-08-29 ENCOUNTER — Other Ambulatory Visit (HOSPITAL_COMMUNITY): Payer: Self-pay

## 2021-09-02 ENCOUNTER — Other Ambulatory Visit (HOSPITAL_COMMUNITY): Payer: Self-pay

## 2021-09-04 ENCOUNTER — Other Ambulatory Visit (HOSPITAL_COMMUNITY): Payer: Self-pay

## 2021-09-04 DIAGNOSIS — M25552 Pain in left hip: Secondary | ICD-10-CM | POA: Diagnosis not present

## 2021-09-11 ENCOUNTER — Other Ambulatory Visit (HOSPITAL_COMMUNITY): Payer: Self-pay

## 2021-09-11 MED ORDER — NURTEC 75 MG PO TBDP
ORAL_TABLET | ORAL | 3 refills | Status: AC
  Filled 2021-09-11: qty 16, 30d supply, fill #0

## 2021-09-12 ENCOUNTER — Other Ambulatory Visit (HOSPITAL_COMMUNITY): Payer: Self-pay

## 2021-09-22 ENCOUNTER — Other Ambulatory Visit (HOSPITAL_COMMUNITY): Payer: Self-pay

## 2021-09-30 ENCOUNTER — Other Ambulatory Visit (HOSPITAL_COMMUNITY): Payer: Self-pay

## 2021-09-30 MED ORDER — DEXLANSOPRAZOLE 60 MG PO CPDR
60.0000 mg | DELAYED_RELEASE_CAPSULE | Freq: Every morning | ORAL | 1 refills | Status: DC
  Filled 2021-09-30: qty 30, 30d supply, fill #0
  Filled 2021-11-07: qty 30, 30d supply, fill #1

## 2021-10-04 ENCOUNTER — Other Ambulatory Visit (HOSPITAL_COMMUNITY): Payer: Self-pay

## 2021-10-13 ENCOUNTER — Other Ambulatory Visit (HOSPITAL_COMMUNITY): Payer: Self-pay

## 2021-10-17 ENCOUNTER — Other Ambulatory Visit (HOSPITAL_COMMUNITY): Payer: Self-pay

## 2021-10-18 ENCOUNTER — Other Ambulatory Visit (HOSPITAL_COMMUNITY): Payer: Self-pay

## 2021-10-18 MED ORDER — HYOSCYAMINE SULFATE 0.125 MG SL SUBL
SUBLINGUAL_TABLET | SUBLINGUAL | 3 refills | Status: DC
  Filled 2021-10-18: qty 60, 10d supply, fill #0
  Filled 2021-12-23: qty 60, 10d supply, fill #1
  Filled 2022-02-11: qty 60, 10d supply, fill #2
  Filled 2022-03-04: qty 60, 10d supply, fill #3

## 2021-10-23 ENCOUNTER — Other Ambulatory Visit (HOSPITAL_COMMUNITY): Payer: Self-pay

## 2021-10-23 DIAGNOSIS — F419 Anxiety disorder, unspecified: Secondary | ICD-10-CM | POA: Diagnosis not present

## 2021-10-23 DIAGNOSIS — R7303 Prediabetes: Secondary | ICD-10-CM | POA: Diagnosis not present

## 2021-10-23 DIAGNOSIS — G43909 Migraine, unspecified, not intractable, without status migrainosus: Secondary | ICD-10-CM | POA: Diagnosis not present

## 2021-10-23 DIAGNOSIS — M159 Polyosteoarthritis, unspecified: Secondary | ICD-10-CM | POA: Diagnosis not present

## 2021-10-23 DIAGNOSIS — L659 Nonscarring hair loss, unspecified: Secondary | ICD-10-CM | POA: Diagnosis not present

## 2021-10-23 DIAGNOSIS — Z23 Encounter for immunization: Secondary | ICD-10-CM | POA: Diagnosis not present

## 2021-10-23 DIAGNOSIS — M7062 Trochanteric bursitis, left hip: Secondary | ICD-10-CM | POA: Diagnosis not present

## 2021-10-23 MED ORDER — NURTEC 75 MG PO TBDP
ORAL_TABLET | ORAL | 3 refills | Status: DC
  Filled 2021-10-23: qty 16, 30d supply, fill #0
  Filled 2021-12-10: qty 16, 30d supply, fill #1
  Filled 2022-01-14: qty 16, 30d supply, fill #2
  Filled 2022-04-02: qty 16, 30d supply, fill #3
  Filled 2022-05-11: qty 16, 30d supply, fill #4
  Filled 2022-05-30 – 2022-09-15 (×2): qty 16, 30d supply, fill #5
  Filled 2022-10-22: qty 16, 30d supply, fill #6

## 2021-10-23 MED ORDER — ALPRAZOLAM 0.25 MG PO TABS
ORAL_TABLET | ORAL | 1 refills | Status: AC
  Filled 2021-10-23: qty 30, 30d supply, fill #0
  Filled 2021-12-10: qty 30, 30d supply, fill #1

## 2021-10-27 ENCOUNTER — Other Ambulatory Visit (HOSPITAL_COMMUNITY): Payer: Self-pay

## 2021-10-28 ENCOUNTER — Other Ambulatory Visit (HOSPITAL_COMMUNITY): Payer: Self-pay

## 2021-10-31 ENCOUNTER — Other Ambulatory Visit (HOSPITAL_COMMUNITY): Payer: Self-pay

## 2021-10-31 MED ORDER — MYRBETRIQ 50 MG PO TB24
ORAL_TABLET | ORAL | 1 refills | Status: AC
  Filled 2021-10-31: qty 30, 30d supply, fill #0
  Filled 2021-11-10: qty 30, 30d supply, fill #1

## 2021-11-01 ENCOUNTER — Other Ambulatory Visit (HOSPITAL_COMMUNITY): Payer: Self-pay

## 2021-11-01 MED ORDER — MYRBETRIQ 50 MG PO TB24
50.0000 mg | ORAL_TABLET | Freq: Every day | ORAL | 6 refills | Status: DC
  Filled 2021-11-01 – 2022-01-01 (×3): qty 30, 30d supply, fill #0
  Filled 2022-01-27: qty 30, 30d supply, fill #1
  Filled 2022-02-26: qty 30, 30d supply, fill #2
  Filled 2022-03-19 – 2022-04-02 (×2): qty 30, 30d supply, fill #3
  Filled 2022-04-30: qty 30, 30d supply, fill #4
  Filled 2022-05-30: qty 30, 30d supply, fill #5
  Filled 2022-07-02: qty 30, 30d supply, fill #6

## 2021-11-05 ENCOUNTER — Other Ambulatory Visit (HOSPITAL_COMMUNITY): Payer: Self-pay

## 2021-11-05 DIAGNOSIS — R051 Acute cough: Secondary | ICD-10-CM | POA: Diagnosis not present

## 2021-11-05 DIAGNOSIS — J101 Influenza due to other identified influenza virus with other respiratory manifestations: Secondary | ICD-10-CM | POA: Diagnosis not present

## 2021-11-05 DIAGNOSIS — Z03818 Encounter for observation for suspected exposure to other biological agents ruled out: Secondary | ICD-10-CM | POA: Diagnosis not present

## 2021-11-05 MED ORDER — AZITHROMYCIN 250 MG PO TABS
ORAL_TABLET | ORAL | 0 refills | Status: AC
  Filled 2021-11-05: qty 6, 5d supply, fill #0

## 2021-11-05 MED ORDER — HYDROCOD POLST-CPM POLST ER 10-8 MG/5ML PO SUER
ORAL | 0 refills | Status: AC
  Filled 2021-11-05: qty 70, 7d supply, fill #0

## 2021-11-07 ENCOUNTER — Other Ambulatory Visit (HOSPITAL_COMMUNITY): Payer: Self-pay

## 2021-11-08 ENCOUNTER — Other Ambulatory Visit (HOSPITAL_COMMUNITY): Payer: Self-pay

## 2021-11-10 ENCOUNTER — Other Ambulatory Visit (HOSPITAL_COMMUNITY): Payer: Self-pay

## 2021-11-10 MED ORDER — MELOXICAM 15 MG PO TABS
ORAL_TABLET | ORAL | 2 refills | Status: DC
  Filled 2021-11-10: qty 30, 30d supply, fill #0
  Filled 2021-12-10: qty 30, 30d supply, fill #1
  Filled 2021-12-20: qty 30, 30d supply, fill #2

## 2021-11-24 ENCOUNTER — Other Ambulatory Visit (HOSPITAL_COMMUNITY): Payer: Self-pay

## 2021-11-25 DIAGNOSIS — M25552 Pain in left hip: Secondary | ICD-10-CM | POA: Diagnosis not present

## 2021-12-04 ENCOUNTER — Other Ambulatory Visit (HOSPITAL_COMMUNITY): Payer: Self-pay

## 2021-12-05 ENCOUNTER — Other Ambulatory Visit (HOSPITAL_COMMUNITY): Payer: Self-pay

## 2021-12-05 MED ORDER — TRIAMCINOLONE ACETONIDE 0.5 % EX CREA
TOPICAL_CREAM | CUTANEOUS | 1 refills | Status: AC
  Filled 2021-12-05: qty 30, 14d supply, fill #0

## 2021-12-08 DIAGNOSIS — M79645 Pain in left finger(s): Secondary | ICD-10-CM | POA: Diagnosis not present

## 2021-12-08 DIAGNOSIS — M1990 Unspecified osteoarthritis, unspecified site: Secondary | ICD-10-CM | POA: Diagnosis not present

## 2021-12-08 DIAGNOSIS — M8580 Other specified disorders of bone density and structure, unspecified site: Secondary | ICD-10-CM | POA: Diagnosis not present

## 2021-12-08 DIAGNOSIS — M79646 Pain in unspecified finger(s): Secondary | ICD-10-CM | POA: Diagnosis not present

## 2021-12-08 DIAGNOSIS — R768 Other specified abnormal immunological findings in serum: Secondary | ICD-10-CM | POA: Diagnosis not present

## 2021-12-08 DIAGNOSIS — M2550 Pain in unspecified joint: Secondary | ICD-10-CM | POA: Diagnosis not present

## 2021-12-08 DIAGNOSIS — H04129 Dry eye syndrome of unspecified lacrimal gland: Secondary | ICD-10-CM | POA: Diagnosis not present

## 2021-12-08 DIAGNOSIS — M79644 Pain in right finger(s): Secondary | ICD-10-CM | POA: Diagnosis not present

## 2021-12-10 ENCOUNTER — Other Ambulatory Visit (HOSPITAL_COMMUNITY): Payer: Self-pay

## 2021-12-10 MED ORDER — DEXLANSOPRAZOLE 60 MG PO CPDR
DELAYED_RELEASE_CAPSULE | ORAL | 3 refills | Status: AC
  Filled 2021-12-10: qty 30, 30d supply, fill #0
  Filled 2022-01-07 – 2022-11-04 (×5): qty 30, 30d supply, fill #1

## 2021-12-11 ENCOUNTER — Other Ambulatory Visit (HOSPITAL_COMMUNITY): Payer: Self-pay

## 2021-12-21 ENCOUNTER — Other Ambulatory Visit (HOSPITAL_COMMUNITY): Payer: Self-pay

## 2021-12-22 ENCOUNTER — Other Ambulatory Visit (HOSPITAL_COMMUNITY): Payer: Self-pay

## 2021-12-23 ENCOUNTER — Other Ambulatory Visit (HOSPITAL_COMMUNITY): Payer: Self-pay

## 2022-01-01 ENCOUNTER — Other Ambulatory Visit (HOSPITAL_COMMUNITY): Payer: Self-pay

## 2022-01-05 ENCOUNTER — Other Ambulatory Visit (HOSPITAL_COMMUNITY): Payer: Self-pay

## 2022-01-07 ENCOUNTER — Other Ambulatory Visit (HOSPITAL_COMMUNITY): Payer: Self-pay

## 2022-01-09 ENCOUNTER — Other Ambulatory Visit (HOSPITAL_COMMUNITY): Payer: Self-pay

## 2022-01-14 ENCOUNTER — Other Ambulatory Visit (HOSPITAL_COMMUNITY): Payer: Self-pay

## 2022-01-23 ENCOUNTER — Other Ambulatory Visit (HOSPITAL_COMMUNITY): Payer: Self-pay

## 2022-01-23 MED ORDER — WEGOVY 1 MG/0.5ML ~~LOC~~ SOAJ
SUBCUTANEOUS | 5 refills | Status: DC
  Filled 2022-01-23: qty 2, 28d supply, fill #0

## 2022-01-24 ENCOUNTER — Other Ambulatory Visit (HOSPITAL_COMMUNITY): Payer: Self-pay

## 2022-01-27 ENCOUNTER — Other Ambulatory Visit (HOSPITAL_COMMUNITY): Payer: Self-pay

## 2022-01-28 ENCOUNTER — Other Ambulatory Visit (HOSPITAL_COMMUNITY): Payer: Self-pay

## 2022-02-03 ENCOUNTER — Other Ambulatory Visit (HOSPITAL_COMMUNITY): Payer: Self-pay

## 2022-02-04 ENCOUNTER — Other Ambulatory Visit (HOSPITAL_COMMUNITY): Payer: Self-pay

## 2022-02-04 MED ORDER — MELOXICAM 15 MG PO TABS
ORAL_TABLET | ORAL | 3 refills | Status: DC
  Filled 2022-02-04: qty 30, 30d supply, fill #0
  Filled 2022-03-12: qty 30, 30d supply, fill #1
  Filled 2022-04-02 – 2022-04-16 (×2): qty 30, 30d supply, fill #2
  Filled 2022-05-12: qty 30, 30d supply, fill #3

## 2022-02-10 ENCOUNTER — Other Ambulatory Visit (HOSPITAL_COMMUNITY): Payer: Self-pay

## 2022-02-11 ENCOUNTER — Other Ambulatory Visit (HOSPITAL_COMMUNITY): Payer: Self-pay

## 2022-02-11 MED ORDER — OXYCODONE-ACETAMINOPHEN 10-325 MG PO TABS
ORAL_TABLET | ORAL | 0 refills | Status: AC
  Filled 2022-02-11: qty 60, 10d supply, fill #0

## 2022-02-26 ENCOUNTER — Other Ambulatory Visit (HOSPITAL_COMMUNITY): Payer: Self-pay

## 2022-03-04 ENCOUNTER — Other Ambulatory Visit (HOSPITAL_COMMUNITY): Payer: Self-pay

## 2022-03-09 ENCOUNTER — Other Ambulatory Visit (HOSPITAL_COMMUNITY): Payer: Self-pay

## 2022-03-09 DIAGNOSIS — M2550 Pain in unspecified joint: Secondary | ICD-10-CM | POA: Diagnosis not present

## 2022-03-09 DIAGNOSIS — M8580 Other specified disorders of bone density and structure, unspecified site: Secondary | ICD-10-CM | POA: Diagnosis not present

## 2022-03-09 DIAGNOSIS — M25559 Pain in unspecified hip: Secondary | ICD-10-CM | POA: Diagnosis not present

## 2022-03-09 DIAGNOSIS — H04129 Dry eye syndrome of unspecified lacrimal gland: Secondary | ICD-10-CM | POA: Diagnosis not present

## 2022-03-09 DIAGNOSIS — M549 Dorsalgia, unspecified: Secondary | ICD-10-CM | POA: Diagnosis not present

## 2022-03-09 DIAGNOSIS — M1990 Unspecified osteoarthritis, unspecified site: Secondary | ICD-10-CM | POA: Diagnosis not present

## 2022-03-09 DIAGNOSIS — M79643 Pain in unspecified hand: Secondary | ICD-10-CM | POA: Diagnosis not present

## 2022-03-09 DIAGNOSIS — M79646 Pain in unspecified finger(s): Secondary | ICD-10-CM | POA: Diagnosis not present

## 2022-03-09 DIAGNOSIS — R768 Other specified abnormal immunological findings in serum: Secondary | ICD-10-CM | POA: Diagnosis not present

## 2022-03-09 MED ORDER — WEGOVY 1.7 MG/0.75ML ~~LOC~~ SOAJ
SUBCUTANEOUS | 5 refills | Status: DC
  Filled 2022-03-09: qty 3, 30d supply, fill #0

## 2022-03-09 MED ORDER — PREDNISONE 5 MG PO TABS
ORAL_TABLET | ORAL | 0 refills | Status: AC
  Filled 2022-03-09: qty 42, 12d supply, fill #0

## 2022-03-12 ENCOUNTER — Other Ambulatory Visit (HOSPITAL_COMMUNITY): Payer: Self-pay

## 2022-03-19 ENCOUNTER — Other Ambulatory Visit (HOSPITAL_COMMUNITY): Payer: Self-pay

## 2022-04-02 ENCOUNTER — Other Ambulatory Visit (HOSPITAL_COMMUNITY): Payer: Self-pay

## 2022-04-04 ENCOUNTER — Other Ambulatory Visit (HOSPITAL_COMMUNITY): Payer: Self-pay

## 2022-04-04 MED ORDER — HYOSCYAMINE SULFATE SL 0.125 MG SL SUBL
SUBLINGUAL_TABLET | SUBLINGUAL | 0 refills | Status: DC
  Filled 2022-04-04: qty 60, 10d supply, fill #0

## 2022-04-16 ENCOUNTER — Other Ambulatory Visit (HOSPITAL_COMMUNITY): Payer: Self-pay

## 2022-04-20 ENCOUNTER — Other Ambulatory Visit (HOSPITAL_COMMUNITY): Payer: Self-pay

## 2022-04-20 DIAGNOSIS — M79645 Pain in left finger(s): Secondary | ICD-10-CM | POA: Diagnosis not present

## 2022-04-20 DIAGNOSIS — M25559 Pain in unspecified hip: Secondary | ICD-10-CM | POA: Diagnosis not present

## 2022-04-20 DIAGNOSIS — M79644 Pain in right finger(s): Secondary | ICD-10-CM | POA: Diagnosis not present

## 2022-04-20 DIAGNOSIS — H04129 Dry eye syndrome of unspecified lacrimal gland: Secondary | ICD-10-CM | POA: Diagnosis not present

## 2022-04-20 DIAGNOSIS — M79643 Pain in unspecified hand: Secondary | ICD-10-CM | POA: Diagnosis not present

## 2022-04-20 DIAGNOSIS — R768 Other specified abnormal immunological findings in serum: Secondary | ICD-10-CM | POA: Diagnosis not present

## 2022-04-20 DIAGNOSIS — M1990 Unspecified osteoarthritis, unspecified site: Secondary | ICD-10-CM | POA: Diagnosis not present

## 2022-04-20 DIAGNOSIS — M2550 Pain in unspecified joint: Secondary | ICD-10-CM | POA: Diagnosis not present

## 2022-04-20 DIAGNOSIS — M8580 Other specified disorders of bone density and structure, unspecified site: Secondary | ICD-10-CM | POA: Diagnosis not present

## 2022-04-20 MED ORDER — HYDROXYCHLOROQUINE SULFATE 200 MG PO TABS
400.0000 mg | ORAL_TABLET | Freq: Every day | ORAL | 2 refills | Status: AC
  Filled 2022-04-20: qty 60, 30d supply, fill #0
  Filled 2022-05-30: qty 60, 30d supply, fill #1
  Filled 2022-08-03: qty 60, 30d supply, fill #2

## 2022-04-21 DIAGNOSIS — Z23 Encounter for immunization: Secondary | ICD-10-CM | POA: Diagnosis not present

## 2022-04-21 DIAGNOSIS — B351 Tinea unguium: Secondary | ICD-10-CM | POA: Diagnosis not present

## 2022-04-29 ENCOUNTER — Other Ambulatory Visit (HOSPITAL_COMMUNITY): Payer: Self-pay

## 2022-04-30 ENCOUNTER — Other Ambulatory Visit (HOSPITAL_COMMUNITY): Payer: Self-pay

## 2022-04-30 MED ORDER — HYOSCYAMINE SULFATE SL 0.125 MG SL SUBL
SUBLINGUAL_TABLET | SUBLINGUAL | 0 refills | Status: DC
  Filled 2022-04-30: qty 60, 10d supply, fill #0

## 2022-05-11 ENCOUNTER — Other Ambulatory Visit (HOSPITAL_COMMUNITY): Payer: Self-pay

## 2022-05-12 ENCOUNTER — Other Ambulatory Visit (HOSPITAL_COMMUNITY): Payer: Self-pay

## 2022-05-30 ENCOUNTER — Other Ambulatory Visit (HOSPITAL_COMMUNITY): Payer: Self-pay

## 2022-06-13 ENCOUNTER — Other Ambulatory Visit (HOSPITAL_COMMUNITY): Payer: Self-pay

## 2022-06-15 ENCOUNTER — Other Ambulatory Visit (HOSPITAL_COMMUNITY): Payer: Self-pay

## 2022-06-15 MED ORDER — MELOXICAM 15 MG PO TABS
ORAL_TABLET | ORAL | 3 refills | Status: DC
  Filled 2022-06-15: qty 30, 30d supply, fill #0
  Filled 2022-06-21 – 2022-07-08 (×2): qty 30, 30d supply, fill #1
  Filled 2022-08-13: qty 30, 30d supply, fill #2
  Filled 2022-09-18: qty 30, 30d supply, fill #3

## 2022-06-22 ENCOUNTER — Other Ambulatory Visit (HOSPITAL_COMMUNITY): Payer: Self-pay

## 2022-07-02 ENCOUNTER — Other Ambulatory Visit (HOSPITAL_COMMUNITY): Payer: Self-pay

## 2022-07-06 ENCOUNTER — Other Ambulatory Visit (HOSPITAL_COMMUNITY): Payer: Self-pay

## 2022-07-07 ENCOUNTER — Other Ambulatory Visit (HOSPITAL_COMMUNITY): Payer: Self-pay

## 2022-07-07 MED ORDER — HYOSCYAMINE SULFATE SL 0.125 MG SL SUBL
SUBLINGUAL_TABLET | SUBLINGUAL | 1 refills | Status: AC
  Filled 2022-07-07: qty 60, 10d supply, fill #0

## 2022-07-07 MED ORDER — OXYCODONE-ACETAMINOPHEN 10-325 MG PO TABS
ORAL_TABLET | ORAL | 0 refills | Status: AC
  Filled 2022-07-07: qty 30, 5d supply, fill #0

## 2022-07-07 MED ORDER — HYOSCYAMINE SULFATE 0.125 MG SL SUBL
SUBLINGUAL_TABLET | SUBLINGUAL | 3 refills | Status: AC
  Filled 2022-07-07: qty 60, 10d supply, fill #0

## 2022-07-08 ENCOUNTER — Other Ambulatory Visit (HOSPITAL_COMMUNITY): Payer: Self-pay

## 2022-07-09 ENCOUNTER — Other Ambulatory Visit (HOSPITAL_COMMUNITY): Payer: Self-pay

## 2022-07-13 ENCOUNTER — Other Ambulatory Visit (HOSPITAL_COMMUNITY): Payer: Self-pay

## 2022-07-15 ENCOUNTER — Other Ambulatory Visit (HOSPITAL_COMMUNITY): Payer: Self-pay

## 2022-07-24 ENCOUNTER — Other Ambulatory Visit (HOSPITAL_COMMUNITY): Payer: Self-pay

## 2022-07-24 MED ORDER — BELSOMRA 20 MG PO TABS
20.0000 mg | ORAL_TABLET | Freq: Every evening | ORAL | 0 refills | Status: AC
Start: 2022-07-24 — End: ?
  Filled 2022-07-24: qty 30, 30d supply, fill #0

## 2022-07-28 ENCOUNTER — Other Ambulatory Visit (HOSPITAL_COMMUNITY): Payer: Self-pay

## 2022-07-31 ENCOUNTER — Other Ambulatory Visit (HOSPITAL_COMMUNITY): Payer: Self-pay

## 2022-08-03 ENCOUNTER — Other Ambulatory Visit (HOSPITAL_COMMUNITY): Payer: Self-pay

## 2022-08-03 MED ORDER — MYRBETRIQ 50 MG PO TB24
50.0000 mg | ORAL_TABLET | Freq: Every day | ORAL | 2 refills | Status: DC
  Filled 2022-08-03: qty 30, 30d supply, fill #0
  Filled 2022-10-12: qty 30, 30d supply, fill #1
  Filled 2022-11-16: qty 30, 30d supply, fill #2

## 2022-08-04 ENCOUNTER — Other Ambulatory Visit (HOSPITAL_COMMUNITY): Payer: Self-pay

## 2022-08-04 MED ORDER — ELETRIPTAN HYDROBROMIDE 40 MG PO TABS
ORAL_TABLET | ORAL | 5 refills | Status: DC
  Filled 2022-08-04: qty 9, 30d supply, fill #0

## 2022-08-07 ENCOUNTER — Other Ambulatory Visit (HOSPITAL_COMMUNITY): Payer: Self-pay

## 2022-08-07 MED ORDER — RIZATRIPTAN BENZOATE 10 MG PO TABS
ORAL_TABLET | ORAL | 5 refills | Status: AC
  Filled 2022-08-07: qty 9, 30d supply, fill #0

## 2022-08-13 ENCOUNTER — Other Ambulatory Visit (HOSPITAL_COMMUNITY): Payer: Self-pay

## 2022-08-21 ENCOUNTER — Other Ambulatory Visit (HOSPITAL_COMMUNITY): Payer: Self-pay

## 2022-08-22 ENCOUNTER — Other Ambulatory Visit (HOSPITAL_COMMUNITY): Payer: Self-pay

## 2022-08-22 MED ORDER — BELSOMRA 20 MG PO TABS
ORAL_TABLET | ORAL | 0 refills | Status: AC
  Filled 2022-08-22 – 2022-08-25 (×2): qty 30, 30d supply, fill #0

## 2022-08-25 ENCOUNTER — Other Ambulatory Visit (HOSPITAL_COMMUNITY): Payer: Self-pay

## 2022-08-26 ENCOUNTER — Other Ambulatory Visit (HOSPITAL_COMMUNITY): Payer: Self-pay

## 2022-08-27 ENCOUNTER — Other Ambulatory Visit (HOSPITAL_COMMUNITY): Payer: Self-pay

## 2022-09-09 ENCOUNTER — Other Ambulatory Visit (HOSPITAL_COMMUNITY): Payer: Self-pay

## 2022-09-09 DIAGNOSIS — E7800 Pure hypercholesterolemia, unspecified: Secondary | ICD-10-CM | POA: Diagnosis not present

## 2022-09-09 DIAGNOSIS — G43909 Migraine, unspecified, not intractable, without status migrainosus: Secondary | ICD-10-CM | POA: Diagnosis not present

## 2022-09-09 DIAGNOSIS — F5101 Primary insomnia: Secondary | ICD-10-CM | POA: Diagnosis not present

## 2022-09-09 DIAGNOSIS — Z6838 Body mass index (BMI) 38.0-38.9, adult: Secondary | ICD-10-CM | POA: Diagnosis not present

## 2022-09-09 DIAGNOSIS — R7303 Prediabetes: Secondary | ICD-10-CM | POA: Diagnosis not present

## 2022-09-09 DIAGNOSIS — F419 Anxiety disorder, unspecified: Secondary | ICD-10-CM | POA: Diagnosis not present

## 2022-09-09 DIAGNOSIS — M159 Polyosteoarthritis, unspecified: Secondary | ICD-10-CM | POA: Diagnosis not present

## 2022-09-09 DIAGNOSIS — Z23 Encounter for immunization: Secondary | ICD-10-CM | POA: Diagnosis not present

## 2022-09-09 DIAGNOSIS — M7062 Trochanteric bursitis, left hip: Secondary | ICD-10-CM | POA: Diagnosis not present

## 2022-09-09 MED ORDER — WEGOVY 1 MG/0.5ML ~~LOC~~ SOAJ
1.0000 mg | SUBCUTANEOUS | 5 refills | Status: DC
  Filled 2022-09-09: qty 2, 28d supply, fill #0
  Filled 2022-10-22: qty 2, 28d supply, fill #1
  Filled 2022-11-16: qty 2, 28d supply, fill #2
  Filled 2022-12-22: qty 2, 28d supply, fill #3
  Filled 2023-01-25: qty 2, 28d supply, fill #4

## 2022-09-10 ENCOUNTER — Other Ambulatory Visit (HOSPITAL_COMMUNITY): Payer: Self-pay

## 2022-09-15 ENCOUNTER — Other Ambulatory Visit (HOSPITAL_COMMUNITY): Payer: Self-pay

## 2022-09-16 ENCOUNTER — Other Ambulatory Visit (HOSPITAL_COMMUNITY): Payer: Self-pay

## 2022-09-18 ENCOUNTER — Other Ambulatory Visit (HOSPITAL_COMMUNITY): Payer: Self-pay

## 2022-09-18 MED ORDER — BELSOMRA 20 MG PO TABS
1.0000 | ORAL_TABLET | Freq: Every evening | ORAL | 5 refills | Status: AC | PRN
  Filled 2022-09-18 – 2022-09-21 (×2): qty 30, 30d supply, fill #0

## 2022-09-19 ENCOUNTER — Other Ambulatory Visit (HOSPITAL_COMMUNITY): Payer: Self-pay

## 2022-09-21 ENCOUNTER — Other Ambulatory Visit (HOSPITAL_COMMUNITY): Payer: Self-pay

## 2022-09-22 ENCOUNTER — Other Ambulatory Visit (HOSPITAL_COMMUNITY): Payer: Self-pay

## 2022-09-30 ENCOUNTER — Other Ambulatory Visit (HOSPITAL_COMMUNITY): Payer: Self-pay

## 2022-09-30 DIAGNOSIS — U071 COVID-19: Secondary | ICD-10-CM | POA: Diagnosis not present

## 2022-09-30 DIAGNOSIS — R059 Cough, unspecified: Secondary | ICD-10-CM | POA: Diagnosis not present

## 2022-09-30 MED ORDER — PAXLOVID (300/100) 20 X 150 MG & 10 X 100MG PO TBPK
ORAL_TABLET | ORAL | 0 refills | Status: AC
  Filled 2022-09-30: qty 30, 5d supply, fill #0

## 2022-10-12 ENCOUNTER — Other Ambulatory Visit (HOSPITAL_COMMUNITY): Payer: Self-pay

## 2022-10-14 ENCOUNTER — Encounter (HOSPITAL_COMMUNITY): Payer: Self-pay | Admitting: Pharmacist

## 2022-10-14 ENCOUNTER — Other Ambulatory Visit (HOSPITAL_COMMUNITY): Payer: Self-pay

## 2022-10-15 ENCOUNTER — Other Ambulatory Visit (HOSPITAL_COMMUNITY): Payer: Self-pay

## 2022-10-16 ENCOUNTER — Other Ambulatory Visit (HOSPITAL_COMMUNITY): Payer: Self-pay

## 2022-10-16 MED ORDER — ONDANSETRON 8 MG PO TBDP
8.0000 mg | ORAL_TABLET | Freq: Three times a day (TID) | ORAL | 1 refills | Status: AC | PRN
  Filled 2022-10-16: qty 15, 5d supply, fill #0

## 2022-10-18 ENCOUNTER — Other Ambulatory Visit (HOSPITAL_COMMUNITY): Payer: Self-pay

## 2022-10-21 ENCOUNTER — Other Ambulatory Visit (HOSPITAL_COMMUNITY): Payer: Self-pay

## 2022-10-21 MED ORDER — MELOXICAM 15 MG PO TABS
15.0000 mg | ORAL_TABLET | Freq: Every day | ORAL | 2 refills | Status: DC | PRN
  Filled 2022-10-21: qty 30, 30d supply, fill #0
  Filled 2022-11-19: qty 30, 30d supply, fill #1
  Filled 2022-12-17: qty 30, 30d supply, fill #2

## 2022-10-22 ENCOUNTER — Other Ambulatory Visit (HOSPITAL_COMMUNITY): Payer: Self-pay

## 2022-10-28 ENCOUNTER — Other Ambulatory Visit (HOSPITAL_COMMUNITY): Payer: Self-pay

## 2022-11-04 ENCOUNTER — Other Ambulatory Visit (HOSPITAL_COMMUNITY): Payer: Self-pay

## 2022-11-05 ENCOUNTER — Other Ambulatory Visit (HOSPITAL_COMMUNITY): Payer: Self-pay

## 2022-11-05 MED ORDER — OXYCODONE-ACETAMINOPHEN 10-325 MG PO TABS
1.0000 | ORAL_TABLET | Freq: Three times a day (TID) | ORAL | 0 refills | Status: AC | PRN
  Filled 2022-11-05: qty 30, 5d supply, fill #0

## 2022-11-06 ENCOUNTER — Other Ambulatory Visit (HOSPITAL_COMMUNITY): Payer: Self-pay

## 2022-11-09 ENCOUNTER — Other Ambulatory Visit (HOSPITAL_COMMUNITY): Payer: Self-pay

## 2022-11-10 DIAGNOSIS — M25552 Pain in left hip: Secondary | ICD-10-CM | POA: Diagnosis not present

## 2022-11-16 ENCOUNTER — Other Ambulatory Visit (HOSPITAL_COMMUNITY): Payer: Self-pay

## 2022-11-19 ENCOUNTER — Other Ambulatory Visit (HOSPITAL_COMMUNITY): Payer: Self-pay

## 2022-12-17 ENCOUNTER — Other Ambulatory Visit (HOSPITAL_COMMUNITY): Payer: Self-pay

## 2022-12-18 ENCOUNTER — Other Ambulatory Visit (HOSPITAL_COMMUNITY): Payer: Self-pay

## 2022-12-18 MED ORDER — CIPROFLOXACIN HCL 250 MG PO TABS
250.0000 mg | ORAL_TABLET | Freq: Two times a day (BID) | ORAL | 0 refills | Status: AC
  Filled 2022-12-18 (×2): qty 20, 10d supply, fill #0

## 2022-12-18 MED ORDER — MYRBETRIQ 50 MG PO TB24
50.0000 mg | ORAL_TABLET | Freq: Every day | ORAL | 3 refills | Status: DC
  Filled 2022-12-18: qty 30, 30d supply, fill #0
  Filled 2023-01-15: qty 30, 30d supply, fill #1
  Filled 2023-02-15: qty 30, 30d supply, fill #2
  Filled 2023-03-15: qty 30, 30d supply, fill #3

## 2022-12-18 MED ORDER — OXYCODONE-ACETAMINOPHEN 10-325 MG PO TABS
ORAL_TABLET | Freq: Three times a day (TID) | ORAL | 0 refills | Status: AC | PRN
  Filled 2022-12-18: qty 30, 10d supply, fill #0
  Filled 2022-12-18: qty 30, 4d supply, fill #0

## 2022-12-22 ENCOUNTER — Other Ambulatory Visit: Payer: Self-pay

## 2022-12-23 ENCOUNTER — Other Ambulatory Visit (HOSPITAL_COMMUNITY): Payer: Self-pay

## 2022-12-23 MED ORDER — MELOXICAM 15 MG PO TABS
ORAL_TABLET | ORAL | 3 refills | Status: DC
  Filled 2023-01-15: qty 30, 30d supply, fill #0
  Filled 2023-02-17: qty 30, 30d supply, fill #1
  Filled 2023-03-15: qty 30, 30d supply, fill #2
  Filled 2023-04-07: qty 30, 30d supply, fill #3

## 2022-12-25 ENCOUNTER — Other Ambulatory Visit (HOSPITAL_COMMUNITY): Payer: Self-pay

## 2023-01-15 ENCOUNTER — Other Ambulatory Visit (HOSPITAL_COMMUNITY): Payer: Self-pay

## 2023-01-25 ENCOUNTER — Other Ambulatory Visit (HOSPITAL_COMMUNITY): Payer: Self-pay

## 2023-02-08 ENCOUNTER — Other Ambulatory Visit (HOSPITAL_COMMUNITY): Payer: Self-pay

## 2023-02-09 ENCOUNTER — Other Ambulatory Visit (HOSPITAL_COMMUNITY): Payer: Self-pay

## 2023-02-10 ENCOUNTER — Other Ambulatory Visit (HOSPITAL_COMMUNITY): Payer: Self-pay

## 2023-02-10 MED ORDER — WEGOVY 1.7 MG/0.75ML ~~LOC~~ SOAJ
1.7000 mg | SUBCUTANEOUS | 5 refills | Status: AC
  Filled 2023-02-10: qty 3, 28d supply, fill #0
  Filled 2023-03-06: qty 3, 28d supply, fill #1
  Filled 2023-04-07: qty 3, 28d supply, fill #2
  Filled 2023-05-05 – 2023-05-14 (×2): qty 3, 28d supply, fill #3

## 2023-02-15 ENCOUNTER — Other Ambulatory Visit (HOSPITAL_COMMUNITY): Payer: Self-pay

## 2023-02-17 ENCOUNTER — Other Ambulatory Visit (HOSPITAL_COMMUNITY): Payer: Self-pay

## 2023-02-18 ENCOUNTER — Other Ambulatory Visit (HOSPITAL_COMMUNITY): Payer: Self-pay

## 2023-02-18 ENCOUNTER — Other Ambulatory Visit: Payer: Self-pay

## 2023-02-18 MED ORDER — NURTEC 75 MG PO TBDP
ORAL_TABLET | ORAL | 3 refills | Status: DC
  Filled 2023-02-18: qty 8, 30d supply, fill #0
  Filled 2023-03-15: qty 8, 30d supply, fill #1
  Filled 2023-04-07: qty 8, 30d supply, fill #2
  Filled 2023-05-14: qty 8, 30d supply, fill #3
  Filled 2023-06-08: qty 8, 30d supply, fill #4
  Filled 2023-07-08: qty 8, 30d supply, fill #5
  Filled 2023-08-09: qty 8, 30d supply, fill #6
  Filled 2023-11-29: qty 8, 30d supply, fill #7
  Filled 2023-12-09: qty 8, 30d supply, fill #8
  Filled 2023-12-14: qty 8, 15d supply, fill #8
  Filled 2024-02-07 – 2024-02-08 (×2): qty 8, 15d supply, fill #10

## 2023-03-02 DIAGNOSIS — R269 Unspecified abnormalities of gait and mobility: Secondary | ICD-10-CM | POA: Diagnosis not present

## 2023-03-02 DIAGNOSIS — M7061 Trochanteric bursitis, right hip: Secondary | ICD-10-CM | POA: Diagnosis not present

## 2023-03-02 DIAGNOSIS — M25552 Pain in left hip: Secondary | ICD-10-CM | POA: Diagnosis not present

## 2023-03-02 DIAGNOSIS — M25551 Pain in right hip: Secondary | ICD-10-CM | POA: Diagnosis not present

## 2023-03-06 ENCOUNTER — Other Ambulatory Visit (HOSPITAL_COMMUNITY): Payer: Self-pay

## 2023-03-15 ENCOUNTER — Other Ambulatory Visit (HOSPITAL_COMMUNITY): Payer: Self-pay

## 2023-03-15 ENCOUNTER — Other Ambulatory Visit: Payer: Self-pay

## 2023-03-18 DIAGNOSIS — K219 Gastro-esophageal reflux disease without esophagitis: Secondary | ICD-10-CM | POA: Diagnosis not present

## 2023-03-18 DIAGNOSIS — F419 Anxiety disorder, unspecified: Secondary | ICD-10-CM | POA: Diagnosis not present

## 2023-03-18 DIAGNOSIS — M159 Polyosteoarthritis, unspecified: Secondary | ICD-10-CM | POA: Diagnosis not present

## 2023-03-18 DIAGNOSIS — G43909 Migraine, unspecified, not intractable, without status migrainosus: Secondary | ICD-10-CM | POA: Diagnosis not present

## 2023-03-18 DIAGNOSIS — R7303 Prediabetes: Secondary | ICD-10-CM | POA: Diagnosis not present

## 2023-03-18 DIAGNOSIS — E7800 Pure hypercholesterolemia, unspecified: Secondary | ICD-10-CM | POA: Diagnosis not present

## 2023-03-29 ENCOUNTER — Other Ambulatory Visit (HOSPITAL_COMMUNITY): Payer: Self-pay

## 2023-03-29 ENCOUNTER — Other Ambulatory Visit: Payer: Self-pay

## 2023-03-29 MED ORDER — AZITHROMYCIN 250 MG PO TABS
ORAL_TABLET | ORAL | 0 refills | Status: AC
  Filled 2023-03-29: qty 6, 5d supply, fill #0

## 2023-03-30 ENCOUNTER — Other Ambulatory Visit: Payer: Self-pay

## 2023-03-31 ENCOUNTER — Other Ambulatory Visit: Payer: Self-pay

## 2023-04-07 ENCOUNTER — Other Ambulatory Visit (HOSPITAL_COMMUNITY): Payer: Self-pay

## 2023-04-08 ENCOUNTER — Other Ambulatory Visit (HOSPITAL_COMMUNITY): Payer: Self-pay

## 2023-04-08 ENCOUNTER — Other Ambulatory Visit: Payer: Self-pay

## 2023-04-08 MED ORDER — MYRBETRIQ 50 MG PO TB24
50.0000 mg | ORAL_TABLET | Freq: Every day | ORAL | 3 refills | Status: DC
  Filled 2023-04-08: qty 30, 30d supply, fill #0
  Filled 2023-05-14 – 2023-05-25 (×4): qty 30, 30d supply, fill #1
  Filled 2023-06-21: qty 30, 30d supply, fill #2
  Filled 2023-07-24: qty 30, 30d supply, fill #3

## 2023-05-05 ENCOUNTER — Other Ambulatory Visit: Payer: Self-pay

## 2023-05-05 ENCOUNTER — Other Ambulatory Visit (HOSPITAL_COMMUNITY): Payer: Self-pay

## 2023-05-05 MED ORDER — MELOXICAM 15 MG PO TABS
15.0000 mg | ORAL_TABLET | Freq: Every day | ORAL | 5 refills | Status: DC | PRN
  Filled 2023-05-05: qty 30, 30d supply, fill #0
  Filled 2023-07-05: qty 30, 30d supply, fill #1
  Filled 2023-08-06: qty 30, 30d supply, fill #2
  Filled 2023-09-04: qty 30, 30d supply, fill #3
  Filled 2023-10-05: qty 30, 30d supply, fill #4
  Filled 2023-11-03: qty 30, 30d supply, fill #5

## 2023-05-14 ENCOUNTER — Other Ambulatory Visit (HOSPITAL_COMMUNITY): Payer: Self-pay

## 2023-05-14 ENCOUNTER — Other Ambulatory Visit: Payer: Self-pay

## 2023-05-19 ENCOUNTER — Other Ambulatory Visit (HOSPITAL_COMMUNITY): Payer: Self-pay

## 2023-05-19 DIAGNOSIS — R32 Unspecified urinary incontinence: Secondary | ICD-10-CM | POA: Diagnosis not present

## 2023-05-20 ENCOUNTER — Encounter: Payer: Self-pay | Admitting: Pharmacist

## 2023-05-20 ENCOUNTER — Other Ambulatory Visit: Payer: Self-pay

## 2023-05-24 ENCOUNTER — Other Ambulatory Visit (HOSPITAL_COMMUNITY): Payer: Self-pay

## 2023-05-24 MED ORDER — SULFAMETHOXAZOLE-TRIMETHOPRIM 800-160 MG PO TABS
1.0000 | ORAL_TABLET | Freq: Two times a day (BID) | ORAL | 0 refills | Status: AC
  Filled 2023-05-24: qty 14, 7d supply, fill #0

## 2023-05-25 ENCOUNTER — Other Ambulatory Visit (HOSPITAL_COMMUNITY): Payer: Self-pay

## 2023-05-25 ENCOUNTER — Other Ambulatory Visit: Payer: Self-pay

## 2023-05-25 ENCOUNTER — Encounter: Payer: Self-pay | Admitting: Pharmacist

## 2023-06-08 ENCOUNTER — Other Ambulatory Visit (HOSPITAL_COMMUNITY): Payer: Self-pay

## 2023-06-21 ENCOUNTER — Other Ambulatory Visit (HOSPITAL_COMMUNITY): Payer: Self-pay

## 2023-06-23 ENCOUNTER — Other Ambulatory Visit (HOSPITAL_COMMUNITY): Payer: Self-pay

## 2023-06-25 ENCOUNTER — Other Ambulatory Visit: Payer: Self-pay

## 2023-06-25 ENCOUNTER — Other Ambulatory Visit (HOSPITAL_COMMUNITY): Payer: Self-pay

## 2023-06-25 MED ORDER — OXYCODONE-ACETAMINOPHEN 10-325 MG PO TABS
1.0000 | ORAL_TABLET | Freq: Three times a day (TID) | ORAL | 0 refills | Status: AC | PRN
  Filled 2023-06-25: qty 30, 5d supply, fill #0

## 2023-07-03 ENCOUNTER — Other Ambulatory Visit (HOSPITAL_COMMUNITY): Payer: Self-pay

## 2023-07-05 ENCOUNTER — Other Ambulatory Visit (HOSPITAL_COMMUNITY): Payer: Self-pay

## 2023-07-08 ENCOUNTER — Other Ambulatory Visit (HOSPITAL_COMMUNITY): Payer: Self-pay

## 2023-07-15 DIAGNOSIS — G43909 Migraine, unspecified, not intractable, without status migrainosus: Secondary | ICD-10-CM | POA: Diagnosis not present

## 2023-07-15 DIAGNOSIS — F419 Anxiety disorder, unspecified: Secondary | ICD-10-CM | POA: Diagnosis not present

## 2023-07-15 DIAGNOSIS — K219 Gastro-esophageal reflux disease without esophagitis: Secondary | ICD-10-CM | POA: Diagnosis not present

## 2023-07-15 DIAGNOSIS — M159 Polyosteoarthritis, unspecified: Secondary | ICD-10-CM | POA: Diagnosis not present

## 2023-07-15 DIAGNOSIS — R42 Dizziness and giddiness: Secondary | ICD-10-CM | POA: Diagnosis not present

## 2023-07-15 DIAGNOSIS — E7800 Pure hypercholesterolemia, unspecified: Secondary | ICD-10-CM | POA: Diagnosis not present

## 2023-07-15 DIAGNOSIS — R7303 Prediabetes: Secondary | ICD-10-CM | POA: Diagnosis not present

## 2023-07-24 ENCOUNTER — Other Ambulatory Visit (HOSPITAL_COMMUNITY): Payer: Self-pay

## 2023-08-06 ENCOUNTER — Other Ambulatory Visit: Payer: Self-pay

## 2023-08-09 ENCOUNTER — Other Ambulatory Visit (HOSPITAL_COMMUNITY): Payer: Self-pay

## 2023-08-19 ENCOUNTER — Other Ambulatory Visit (HOSPITAL_COMMUNITY): Payer: Self-pay

## 2023-08-19 MED ORDER — MIRABEGRON ER 50 MG PO TB24
50.0000 mg | ORAL_TABLET | Freq: Every day | ORAL | 3 refills | Status: DC
  Filled 2023-08-19: qty 30, 30d supply, fill #0
  Filled 2023-09-20: qty 30, 30d supply, fill #1
  Filled 2023-10-27: qty 30, 30d supply, fill #2
  Filled 2023-11-29: qty 30, 30d supply, fill #3

## 2023-08-20 ENCOUNTER — Other Ambulatory Visit (HOSPITAL_COMMUNITY): Payer: Self-pay

## 2023-08-31 ENCOUNTER — Other Ambulatory Visit (HOSPITAL_COMMUNITY): Payer: Self-pay

## 2023-08-31 MED ORDER — QULIPTA 30 MG PO TABS
30.0000 mg | ORAL_TABLET | Freq: Every day | ORAL | 5 refills | Status: AC
Start: 2023-08-31 — End: ?
  Filled 2023-08-31: qty 30, 30d supply, fill #0
  Filled 2023-10-05: qty 30, 30d supply, fill #1
  Filled 2024-03-28: qty 30, 30d supply, fill #2

## 2023-09-01 ENCOUNTER — Other Ambulatory Visit (HOSPITAL_COMMUNITY): Payer: Self-pay

## 2023-09-02 ENCOUNTER — Other Ambulatory Visit (HOSPITAL_COMMUNITY): Payer: Self-pay

## 2023-09-04 ENCOUNTER — Other Ambulatory Visit (HOSPITAL_COMMUNITY): Payer: Self-pay

## 2023-09-09 ENCOUNTER — Other Ambulatory Visit (HOSPITAL_COMMUNITY): Payer: Self-pay

## 2023-09-14 ENCOUNTER — Other Ambulatory Visit (HOSPITAL_COMMUNITY): Payer: Self-pay

## 2023-09-20 ENCOUNTER — Other Ambulatory Visit (HOSPITAL_COMMUNITY): Payer: Self-pay

## 2023-09-21 ENCOUNTER — Other Ambulatory Visit (HOSPITAL_COMMUNITY): Payer: Self-pay

## 2023-09-21 ENCOUNTER — Other Ambulatory Visit: Payer: Self-pay

## 2023-09-21 DIAGNOSIS — M159 Polyosteoarthritis, unspecified: Secondary | ICD-10-CM | POA: Diagnosis not present

## 2023-09-21 DIAGNOSIS — Z23 Encounter for immunization: Secondary | ICD-10-CM | POA: Diagnosis not present

## 2023-09-21 DIAGNOSIS — G43909 Migraine, unspecified, not intractable, without status migrainosus: Secondary | ICD-10-CM | POA: Diagnosis not present

## 2023-09-21 DIAGNOSIS — F419 Anxiety disorder, unspecified: Secondary | ICD-10-CM | POA: Diagnosis not present

## 2023-09-21 DIAGNOSIS — K219 Gastro-esophageal reflux disease without esophagitis: Secondary | ICD-10-CM | POA: Diagnosis not present

## 2023-09-21 DIAGNOSIS — F5101 Primary insomnia: Secondary | ICD-10-CM | POA: Diagnosis not present

## 2023-09-21 DIAGNOSIS — E66812 Obesity, class 2: Secondary | ICD-10-CM | POA: Diagnosis not present

## 2023-09-21 MED ORDER — ALPRAZOLAM 0.25 MG PO TABS
0.2500 mg | ORAL_TABLET | Freq: Two times a day (BID) | ORAL | 0 refills | Status: DC
  Filled 2023-09-21: qty 30, 15d supply, fill #0

## 2023-09-21 MED ORDER — OXYCODONE-ACETAMINOPHEN 10-325 MG PO TABS
1.0000 | ORAL_TABLET | Freq: Three times a day (TID) | ORAL | 0 refills | Status: AC | PRN
  Filled 2023-09-21: qty 30, 5d supply, fill #0

## 2023-09-22 ENCOUNTER — Other Ambulatory Visit: Payer: Self-pay

## 2023-09-23 ENCOUNTER — Other Ambulatory Visit (HOSPITAL_COMMUNITY): Payer: Self-pay

## 2023-10-05 ENCOUNTER — Other Ambulatory Visit (HOSPITAL_COMMUNITY): Payer: Self-pay

## 2023-10-08 ENCOUNTER — Other Ambulatory Visit (HOSPITAL_COMMUNITY): Payer: Self-pay

## 2023-10-27 ENCOUNTER — Other Ambulatory Visit (HOSPITAL_COMMUNITY): Payer: Self-pay

## 2023-11-03 ENCOUNTER — Other Ambulatory Visit (HOSPITAL_COMMUNITY): Payer: Self-pay

## 2023-11-04 ENCOUNTER — Encounter: Payer: Self-pay | Admitting: Pharmacist

## 2023-11-04 ENCOUNTER — Other Ambulatory Visit (HOSPITAL_COMMUNITY): Payer: Self-pay

## 2023-11-04 ENCOUNTER — Other Ambulatory Visit: Payer: Self-pay

## 2023-11-04 MED ORDER — AMOXICILLIN 500 MG PO CAPS
500.0000 mg | ORAL_CAPSULE | Freq: Four times a day (QID) | ORAL | 0 refills | Status: DC
  Filled 2023-11-04: qty 25, 7d supply, fill #0

## 2023-11-04 MED ORDER — IBUPROFEN 800 MG PO TABS
800.0000 mg | ORAL_TABLET | Freq: Four times a day (QID) | ORAL | 0 refills | Status: AC | PRN
  Filled 2023-11-04: qty 16, 4d supply, fill #0

## 2023-11-10 ENCOUNTER — Other Ambulatory Visit (HOSPITAL_COMMUNITY): Payer: Self-pay

## 2023-11-29 ENCOUNTER — Other Ambulatory Visit (HOSPITAL_COMMUNITY): Payer: Self-pay

## 2023-11-30 ENCOUNTER — Other Ambulatory Visit (HOSPITAL_COMMUNITY): Payer: Self-pay

## 2023-12-01 ENCOUNTER — Other Ambulatory Visit (HOSPITAL_COMMUNITY): Payer: Self-pay

## 2023-12-02 ENCOUNTER — Other Ambulatory Visit (HOSPITAL_BASED_OUTPATIENT_CLINIC_OR_DEPARTMENT_OTHER): Payer: Self-pay

## 2023-12-02 ENCOUNTER — Other Ambulatory Visit: Payer: Self-pay

## 2023-12-02 ENCOUNTER — Other Ambulatory Visit (HOSPITAL_COMMUNITY): Payer: Self-pay

## 2023-12-02 MED ORDER — ALPRAZOLAM 0.25 MG PO TABS
0.2500 mg | ORAL_TABLET | Freq: Two times a day (BID) | ORAL | 3 refills | Status: AC
  Filled 2023-12-02: qty 30, 15d supply, fill #0

## 2023-12-08 ENCOUNTER — Other Ambulatory Visit (HOSPITAL_COMMUNITY): Payer: Self-pay

## 2023-12-09 ENCOUNTER — Other Ambulatory Visit (HOSPITAL_COMMUNITY): Payer: Self-pay

## 2023-12-09 MED ORDER — MELOXICAM 15 MG PO TABS
15.0000 mg | ORAL_TABLET | Freq: Every day | ORAL | 4 refills | Status: AC | PRN
  Filled 2023-12-09: qty 30, 30d supply, fill #0
  Filled 2024-01-08: qty 30, 30d supply, fill #1
  Filled 2024-02-12 – 2024-02-14 (×2): qty 30, 30d supply, fill #2
  Filled 2024-03-21 (×2): qty 30, 30d supply, fill #3

## 2023-12-14 ENCOUNTER — Other Ambulatory Visit (HOSPITAL_COMMUNITY): Payer: Self-pay

## 2023-12-23 ENCOUNTER — Other Ambulatory Visit (HOSPITAL_COMMUNITY): Payer: Self-pay

## 2023-12-24 ENCOUNTER — Other Ambulatory Visit: Payer: Self-pay

## 2023-12-24 ENCOUNTER — Other Ambulatory Visit (HOSPITAL_COMMUNITY): Payer: Self-pay

## 2023-12-24 MED ORDER — MIRABEGRON ER 50 MG PO TB24
50.0000 mg | ORAL_TABLET | Freq: Every day | ORAL | 4 refills | Status: AC
  Filled 2023-12-24: qty 30, 30d supply, fill #0
  Filled 2024-01-27: qty 30, 30d supply, fill #1
  Filled 2024-03-07 – 2024-03-21 (×3): qty 30, 30d supply, fill #2

## 2023-12-25 ENCOUNTER — Other Ambulatory Visit (HOSPITAL_COMMUNITY): Payer: Self-pay

## 2023-12-25 MED ORDER — OXYCODONE-ACETAMINOPHEN 10-325 MG PO TABS
ORAL_TABLET | ORAL | 0 refills | Status: AC
  Filled 2023-12-25: qty 30, 30d supply, fill #0

## 2023-12-27 ENCOUNTER — Other Ambulatory Visit (HOSPITAL_COMMUNITY): Payer: Self-pay

## 2023-12-27 ENCOUNTER — Other Ambulatory Visit: Payer: Self-pay

## 2024-01-08 ENCOUNTER — Other Ambulatory Visit (HOSPITAL_COMMUNITY): Payer: Self-pay

## 2024-01-17 ENCOUNTER — Other Ambulatory Visit (HOSPITAL_COMMUNITY): Payer: Self-pay

## 2024-01-20 ENCOUNTER — Other Ambulatory Visit (HOSPITAL_COMMUNITY): Payer: Self-pay

## 2024-01-20 ENCOUNTER — Other Ambulatory Visit: Payer: Self-pay

## 2024-01-20 MED ORDER — AMOXICILLIN 500 MG PO CAPS
500.0000 mg | ORAL_CAPSULE | Freq: Three times a day (TID) | ORAL | 0 refills | Status: AC
  Filled 2024-01-20 (×2): qty 21, 7d supply, fill #0

## 2024-01-27 ENCOUNTER — Other Ambulatory Visit (HOSPITAL_COMMUNITY): Payer: Self-pay

## 2024-02-07 ENCOUNTER — Other Ambulatory Visit (HOSPITAL_COMMUNITY): Payer: Self-pay

## 2024-02-12 ENCOUNTER — Other Ambulatory Visit (HOSPITAL_COMMUNITY): Payer: Self-pay

## 2024-02-14 ENCOUNTER — Other Ambulatory Visit (HOSPITAL_COMMUNITY): Payer: Self-pay

## 2024-02-16 ENCOUNTER — Other Ambulatory Visit (HOSPITAL_COMMUNITY): Payer: Self-pay

## 2024-02-29 ENCOUNTER — Other Ambulatory Visit (HOSPITAL_COMMUNITY): Payer: Self-pay

## 2024-03-07 ENCOUNTER — Other Ambulatory Visit (HOSPITAL_COMMUNITY): Payer: Self-pay

## 2024-03-21 ENCOUNTER — Other Ambulatory Visit (HOSPITAL_COMMUNITY): Payer: Self-pay

## 2024-03-22 ENCOUNTER — Other Ambulatory Visit (HOSPITAL_COMMUNITY): Payer: Self-pay

## 2024-03-22 MED ORDER — NURTEC 75 MG PO TBDP
ORAL_TABLET | ORAL | 0 refills | Status: AC
  Filled 2024-03-22: qty 8, 15d supply, fill #0
  Filled 2024-04-26 (×2): qty 8, 15d supply, fill #1

## 2024-03-23 ENCOUNTER — Other Ambulatory Visit (HOSPITAL_COMMUNITY): Payer: Self-pay

## 2024-03-23 ENCOUNTER — Other Ambulatory Visit: Payer: Self-pay

## 2024-03-23 DIAGNOSIS — N3281 Overactive bladder: Secondary | ICD-10-CM | POA: Diagnosis not present

## 2024-03-23 DIAGNOSIS — R7303 Prediabetes: Secondary | ICD-10-CM | POA: Diagnosis not present

## 2024-03-23 DIAGNOSIS — F5101 Primary insomnia: Secondary | ICD-10-CM | POA: Diagnosis not present

## 2024-03-23 DIAGNOSIS — M159 Polyosteoarthritis, unspecified: Secondary | ICD-10-CM | POA: Diagnosis not present

## 2024-03-23 DIAGNOSIS — Z23 Encounter for immunization: Secondary | ICD-10-CM | POA: Diagnosis not present

## 2024-03-23 DIAGNOSIS — M7062 Trochanteric bursitis, left hip: Secondary | ICD-10-CM | POA: Diagnosis not present

## 2024-03-23 DIAGNOSIS — F419 Anxiety disorder, unspecified: Secondary | ICD-10-CM | POA: Diagnosis not present

## 2024-03-23 DIAGNOSIS — G43909 Migraine, unspecified, not intractable, without status migrainosus: Secondary | ICD-10-CM | POA: Diagnosis not present

## 2024-03-23 DIAGNOSIS — K219 Gastro-esophageal reflux disease without esophagitis: Secondary | ICD-10-CM | POA: Diagnosis not present

## 2024-03-23 DIAGNOSIS — E66812 Obesity, class 2: Secondary | ICD-10-CM | POA: Diagnosis not present

## 2024-03-23 MED ORDER — SOLIFENACIN SUCCINATE 5 MG PO TABS
5.0000 mg | ORAL_TABLET | Freq: Every day | ORAL | 5 refills | Status: AC
  Filled 2024-03-23: qty 30, 30d supply, fill #0
  Filled 2024-04-14 – 2024-04-17 (×2): qty 30, 30d supply, fill #1
  Filled 2024-05-17: qty 30, 30d supply, fill #2

## 2024-03-25 ENCOUNTER — Other Ambulatory Visit (HOSPITAL_COMMUNITY): Payer: Self-pay

## 2024-03-28 ENCOUNTER — Other Ambulatory Visit (HOSPITAL_COMMUNITY): Payer: Self-pay

## 2024-03-28 ENCOUNTER — Other Ambulatory Visit: Payer: Self-pay

## 2024-04-03 DIAGNOSIS — R350 Frequency of micturition: Secondary | ICD-10-CM | POA: Diagnosis not present

## 2024-04-04 ENCOUNTER — Other Ambulatory Visit: Payer: Self-pay

## 2024-04-04 ENCOUNTER — Other Ambulatory Visit (HOSPITAL_COMMUNITY): Payer: Self-pay

## 2024-04-04 MED ORDER — OXYCODONE-ACETAMINOPHEN 10-325 MG PO TABS
1.0000 | ORAL_TABLET | Freq: Three times a day (TID) | ORAL | 0 refills | Status: AC
  Filled 2024-04-04 – 2024-06-27 (×2): qty 30, 5d supply, fill #0

## 2024-04-07 ENCOUNTER — Other Ambulatory Visit (HOSPITAL_COMMUNITY): Payer: Self-pay

## 2024-04-10 ENCOUNTER — Other Ambulatory Visit (HOSPITAL_COMMUNITY): Payer: Self-pay

## 2024-04-14 ENCOUNTER — Other Ambulatory Visit (HOSPITAL_COMMUNITY): Payer: Self-pay

## 2024-04-14 MED ORDER — QULIPTA 30 MG PO TABS
30.0000 mg | ORAL_TABLET | Freq: Every day | ORAL | 5 refills | Status: AC
  Filled 2024-04-14: qty 30, 30d supply, fill #0

## 2024-04-14 MED ORDER — GEMTESA 75 MG PO TABS
75.0000 mg | ORAL_TABLET | Freq: Every day | ORAL | 5 refills | Status: AC
  Filled 2024-04-14 – 2024-04-26 (×2): qty 30, 30d supply, fill #0
  Filled 2024-05-17: qty 30, 30d supply, fill #1

## 2024-04-17 ENCOUNTER — Other Ambulatory Visit: Payer: Self-pay

## 2024-04-26 ENCOUNTER — Other Ambulatory Visit (HOSPITAL_COMMUNITY): Payer: Self-pay

## 2024-04-26 ENCOUNTER — Other Ambulatory Visit: Payer: Self-pay

## 2024-05-01 ENCOUNTER — Other Ambulatory Visit (HOSPITAL_COMMUNITY): Payer: Self-pay

## 2024-05-16 DIAGNOSIS — M7062 Trochanteric bursitis, left hip: Secondary | ICD-10-CM | POA: Diagnosis not present

## 2024-05-17 ENCOUNTER — Other Ambulatory Visit (HOSPITAL_COMMUNITY): Payer: Self-pay

## 2024-05-17 ENCOUNTER — Other Ambulatory Visit: Payer: Self-pay

## 2024-05-17 MED ORDER — HYOSCYAMINE SULFATE 0.125 MG SL SUBL
0.1250 mg | SUBLINGUAL_TABLET | SUBLINGUAL | 1 refills | Status: AC | PRN
  Filled 2024-05-17: qty 100, 17d supply, fill #0
  Filled 2024-09-07: qty 30, 5d supply, fill #0

## 2024-05-17 MED ORDER — GEMTESA 75 MG PO TABS
75.0000 mg | ORAL_TABLET | Freq: Every day | ORAL | 1 refills | Status: DC
  Filled 2024-05-17: qty 100, 100d supply, fill #0
  Filled 2024-05-19: qty 90, 90d supply, fill #0
  Filled 2024-07-29: qty 30, 30d supply, fill #1
  Filled 2024-08-12: qty 90, 90d supply, fill #1
  Filled 2024-11-08: qty 90, 90d supply, fill #2

## 2024-05-17 MED ORDER — NURTEC 75 MG PO TBDP
75.0000 mg | ORAL_TABLET | ORAL | 1 refills | Status: AC
  Filled 2024-05-17: qty 8, 30d supply, fill #0
  Filled 2024-09-20 (×2): qty 8, 15d supply, fill #0
  Filled 2024-09-20: qty 100, 90d supply, fill #0
  Filled 2024-09-26: qty 88, 90d supply, fill #0

## 2024-05-17 MED ORDER — MELOXICAM 15 MG PO TABS
15.0000 mg | ORAL_TABLET | Freq: Every day | ORAL | 1 refills | Status: DC | PRN
  Filled 2024-05-17: qty 100, 100d supply, fill #0
  Filled 2024-08-27: qty 100, 100d supply, fill #1

## 2024-05-17 MED ORDER — SOLIFENACIN SUCCINATE 5 MG PO TABS
5.0000 mg | ORAL_TABLET | Freq: Every day | ORAL | 1 refills | Status: DC
  Filled 2024-05-17: qty 100, 100d supply, fill #0
  Filled 2024-08-25: qty 100, 100d supply, fill #1

## 2024-05-18 ENCOUNTER — Other Ambulatory Visit (HOSPITAL_COMMUNITY): Payer: Self-pay

## 2024-05-19 ENCOUNTER — Other Ambulatory Visit (HOSPITAL_COMMUNITY): Payer: Self-pay

## 2024-05-19 ENCOUNTER — Other Ambulatory Visit: Payer: Self-pay

## 2024-05-25 ENCOUNTER — Other Ambulatory Visit (HOSPITAL_COMMUNITY): Payer: Self-pay

## 2024-05-25 ENCOUNTER — Other Ambulatory Visit: Payer: Self-pay

## 2024-06-27 ENCOUNTER — Other Ambulatory Visit (HOSPITAL_COMMUNITY): Payer: Self-pay

## 2024-06-27 ENCOUNTER — Other Ambulatory Visit: Payer: Self-pay

## 2024-06-28 ENCOUNTER — Other Ambulatory Visit (HOSPITAL_COMMUNITY): Payer: Self-pay

## 2024-06-28 ENCOUNTER — Other Ambulatory Visit: Payer: Self-pay

## 2024-06-28 MED ORDER — ALPRAZOLAM 0.25 MG PO TABS
0.2500 mg | ORAL_TABLET | Freq: Two times a day (BID) | ORAL | 3 refills | Status: AC
  Filled 2024-06-28: qty 30, 15d supply, fill #0
  Filled 2024-09-20: qty 30, 15d supply, fill #1

## 2024-07-07 ENCOUNTER — Other Ambulatory Visit (HOSPITAL_COMMUNITY): Payer: Self-pay

## 2024-07-29 ENCOUNTER — Other Ambulatory Visit (HOSPITAL_COMMUNITY): Payer: Self-pay

## 2024-08-12 ENCOUNTER — Other Ambulatory Visit (HOSPITAL_COMMUNITY): Payer: Self-pay

## 2024-08-14 ENCOUNTER — Other Ambulatory Visit (HOSPITAL_COMMUNITY): Payer: Self-pay

## 2024-08-14 ENCOUNTER — Other Ambulatory Visit: Payer: Self-pay

## 2024-08-25 ENCOUNTER — Other Ambulatory Visit: Payer: Self-pay

## 2024-08-27 ENCOUNTER — Other Ambulatory Visit (HOSPITAL_COMMUNITY): Payer: Self-pay

## 2024-09-07 ENCOUNTER — Other Ambulatory Visit (HOSPITAL_COMMUNITY): Payer: Self-pay

## 2024-09-07 ENCOUNTER — Other Ambulatory Visit: Payer: Self-pay

## 2024-09-07 MED ORDER — CYCLOBENZAPRINE HCL 10 MG PO TABS
10.0000 mg | ORAL_TABLET | Freq: Two times a day (BID) | ORAL | 1 refills | Status: AC | PRN
  Filled 2024-09-07: qty 30, 15d supply, fill #0
  Filled 2024-11-28: qty 30, 15d supply, fill #1

## 2024-09-20 ENCOUNTER — Other Ambulatory Visit: Payer: Self-pay

## 2024-09-20 ENCOUNTER — Other Ambulatory Visit (HOSPITAL_COMMUNITY): Payer: Self-pay

## 2024-09-21 ENCOUNTER — Other Ambulatory Visit: Payer: Self-pay

## 2024-09-21 ENCOUNTER — Other Ambulatory Visit (HOSPITAL_BASED_OUTPATIENT_CLINIC_OR_DEPARTMENT_OTHER): Payer: Self-pay | Admitting: Family Medicine

## 2024-09-21 ENCOUNTER — Other Ambulatory Visit (HOSPITAL_COMMUNITY): Payer: Self-pay

## 2024-09-21 DIAGNOSIS — Z23 Encounter for immunization: Secondary | ICD-10-CM | POA: Diagnosis not present

## 2024-09-21 DIAGNOSIS — N3281 Overactive bladder: Secondary | ICD-10-CM | POA: Diagnosis not present

## 2024-09-21 DIAGNOSIS — G43909 Migraine, unspecified, not intractable, without status migrainosus: Secondary | ICD-10-CM | POA: Diagnosis not present

## 2024-09-21 DIAGNOSIS — F419 Anxiety disorder, unspecified: Secondary | ICD-10-CM | POA: Diagnosis not present

## 2024-09-21 DIAGNOSIS — K219 Gastro-esophageal reflux disease without esophagitis: Secondary | ICD-10-CM | POA: Diagnosis not present

## 2024-09-21 DIAGNOSIS — M722 Plantar fascial fibromatosis: Secondary | ICD-10-CM | POA: Diagnosis not present

## 2024-09-21 DIAGNOSIS — M159 Polyosteoarthritis, unspecified: Secondary | ICD-10-CM | POA: Diagnosis not present

## 2024-09-21 DIAGNOSIS — Z136 Encounter for screening for cardiovascular disorders: Secondary | ICD-10-CM

## 2024-09-21 DIAGNOSIS — R7303 Prediabetes: Secondary | ICD-10-CM | POA: Diagnosis not present

## 2024-09-26 ENCOUNTER — Other Ambulatory Visit: Payer: Self-pay

## 2024-09-26 ENCOUNTER — Other Ambulatory Visit (HOSPITAL_COMMUNITY): Payer: Self-pay

## 2024-10-09 ENCOUNTER — Other Ambulatory Visit (HOSPITAL_BASED_OUTPATIENT_CLINIC_OR_DEPARTMENT_OTHER)

## 2024-10-11 ENCOUNTER — Encounter (HOSPITAL_BASED_OUTPATIENT_CLINIC_OR_DEPARTMENT_OTHER): Payer: Self-pay

## 2024-10-11 ENCOUNTER — Ambulatory Visit: Admitting: Podiatry

## 2024-10-11 ENCOUNTER — Other Ambulatory Visit (HOSPITAL_BASED_OUTPATIENT_CLINIC_OR_DEPARTMENT_OTHER)

## 2024-10-16 ENCOUNTER — Other Ambulatory Visit: Payer: Self-pay

## 2024-10-16 ENCOUNTER — Other Ambulatory Visit (HOSPITAL_BASED_OUTPATIENT_CLINIC_OR_DEPARTMENT_OTHER): Payer: Self-pay

## 2024-10-16 ENCOUNTER — Encounter: Payer: Self-pay | Admitting: Podiatry

## 2024-10-16 ENCOUNTER — Ambulatory Visit (INDEPENDENT_AMBULATORY_CARE_PROVIDER_SITE_OTHER)

## 2024-10-16 ENCOUNTER — Ambulatory Visit: Admitting: Podiatry

## 2024-10-16 VITALS — Ht 68.0 in | Wt 263.0 lb

## 2024-10-16 DIAGNOSIS — M722 Plantar fascial fibromatosis: Secondary | ICD-10-CM

## 2024-10-16 MED ORDER — BETAMETHASONE SOD PHOS & ACET 6 (3-3) MG/ML IJ SUSP
3.0000 mg | Freq: Once | INTRAMUSCULAR | Status: AC
  Administered 2024-10-16: 3 mg via INTRA_ARTICULAR

## 2024-10-16 MED ORDER — METHYLPREDNISOLONE 4 MG PO TBPK
ORAL_TABLET | ORAL | 0 refills | Status: AC
  Filled 2024-10-16: qty 21, 6d supply, fill #0

## 2024-10-16 NOTE — Progress Notes (Signed)
 Chief Complaint  Patient presents with   Foot Pain    Pt is here due to left heel pain, states has been going on for a while, the pain is on and off, mostly to the heel of the foot, states it feels like a knife stabbing into the heel, states she stretches the foot to help with the pain.    Subjective: 73 y.o. female presenting today as a new patient for evaluation of left lateral heel pain.  Onset a few weeks ago.  She began wearing minimalist barefoot shoes on a trip to [place] and when she returned she began to experience significant pain and tenderness to the lateral aspect of the left heel.  History of left ankle/leg ORIF several years prior.   Past Medical History:  Diagnosis Date   Chronic meniscal tear of knee 08-23-12   left knee meniscal tear   GERD (gastroesophageal reflux disease) 08-23-12   reflux controlled with Dexilant    Headache(784.0) 08-23-12   tx. migraines -with Botox inj. x3 months   Migraine    PTSD (post-traumatic stress disorder)    URI (upper respiratory infection)    01/30/13- no fever- head congestion with greenish drainage- none now   Past Surgical History:  Procedure Laterality Date   BLEPHAROPLASTY  08-23-12   bilateral   CHOLECYSTECTOMY  08-23-12   lap. Cholecystectomy(stones)   FRACTURE SURGERY  08-23-12   '00-ORIF left leg fracture-retained rod   HARDWARE REMOVAL Left 02/13/2013   Procedure: REMOVAL OF TIBIA NAIL ;  Surgeon: Dempsey LULLA Moan, MD;  Location: [facility];  Service: Orthopedics;  Laterality: Left;  REMOVAL OF TIBIA NAIL    KNEE ARTHROSCOPY  08/24/2012   Procedure: ARTHROSCOPY KNEE;  Surgeon: Dempsey LULLA Moan, MD;  Location: [facility];  Service: Orthopedics;  Laterality: Left;  lateral meniscal debridement   LASIK  08-23-12   bilateral   TOTAL KNEE ARTHROPLASTY Left 02/13/2013   Procedure: TOTAL KNEE ARTHROPLASTY;  Surgeon: Dempsey LULLA Moan, MD;  Location: [facility];  Service: Orthopedics;  Laterality: Left;   Allergies  Allergen Reactions    Hydrocodone-Acetaminophen       Objective: Physical Exam General: The patient is alert and oriented x3 in no acute distress.  Dermatology: Skin is warm, dry and supple bilateral lower extremities. Negative for open lesions or macerations bilateral.   Vascular: Dorsalis Pedis and Posterior Tibial pulses palpable bilateral.  Capillary fill time is immediate to all digits.  Neurological: Grossly intact via light touch.   Musculoskeletal: Tenderness to palpation to the plantar aspect of the lateral aspect of the left heel along the plantar fascia. All other joints range of motion within normal limits bilateral. Strength 5/5 in all groups bilateral.  High arches noted with cavus foot type left lower extremity  Radiographic exam LT foot 10/16/2024: High arches noted with increased calcaneal inclination angle.  Orthopedic hardware noted to the fibula and intramedullary rod to the tibia without any complicating features.  Assessment: 1. Plantar fasciitis left foot; lateral aspect 2.  High arches bilateral  Plan of Care:  -Patient evaluated. Xrays reviewed.   -Injection of 0.5cc Celestone soluspan injected into the left plantar fascia.  -Prescription for Medrol  Dosepak.  Then resume meloxicam  15 mg daily as prescribed -Advised against going barefoot.  Recommend good supportive tennis shoes and sneakers -Recommended [employer] -Return to clinic 4 weeks  *Husband 'Rob' is PCP for [facility]   Thresa EMERSON Sar, DPM [facility]  Dr.  Thresa EMERSON Sar, DPM    [address] [place], [place] [address]                Office [phone]  Fax [phone]

## 2024-10-17 ENCOUNTER — Other Ambulatory Visit (HOSPITAL_COMMUNITY): Payer: Self-pay

## 2024-11-02 ENCOUNTER — Ambulatory Visit (HOSPITAL_BASED_OUTPATIENT_CLINIC_OR_DEPARTMENT_OTHER): Payer: Self-pay

## 2024-11-08 ENCOUNTER — Other Ambulatory Visit (HOSPITAL_COMMUNITY): Payer: Self-pay

## 2024-11-10 ENCOUNTER — Other Ambulatory Visit (HOSPITAL_COMMUNITY): Payer: Self-pay

## 2024-11-10 ENCOUNTER — Other Ambulatory Visit: Payer: Self-pay

## 2024-11-10 MED ORDER — GEMTESA 75 MG PO TABS
1.0000 | ORAL_TABLET | Freq: Every day | ORAL | 1 refills | Status: AC
  Filled 2024-11-10: qty 100, 100d supply, fill #0

## 2024-11-13 ENCOUNTER — Ambulatory Visit: Admitting: Podiatry

## 2024-11-20 ENCOUNTER — Ambulatory Visit: Admitting: Podiatry

## 2024-11-22 ENCOUNTER — Ambulatory Visit: Admitting: Podiatry

## 2024-11-27 ENCOUNTER — Ambulatory Visit: Admitting: Podiatry

## 2024-11-27 ENCOUNTER — Encounter: Payer: Self-pay | Admitting: Podiatry

## 2024-11-27 VITALS — Ht 68.0 in | Wt 263.0 lb

## 2024-11-27 DIAGNOSIS — M722 Plantar fascial fibromatosis: Secondary | ICD-10-CM | POA: Diagnosis not present

## 2024-11-27 MED ORDER — BETAMETHASONE SOD PHOS & ACET 6 (3-3) MG/ML IJ SUSP
3.0000 mg | Freq: Once | INTRAMUSCULAR | Status: AC
  Administered 2024-11-27: 3 mg via INTRA_ARTICULAR

## 2024-11-27 NOTE — Progress Notes (Signed)
 Chief Complaint  Patient presents with   Plantar Fasciitis    Pt is here to f/u on left foot due to pain, she states the pain is about the same as before.    Subjective: 73 y.o. female presenting today as a new patient for evaluation of left lateral heel pain.    Brief history: Onset a few weeks ago.  She began wearing minimalist barefoot shoes on a trip to [place] and when she returned she began to experience significant pain and tenderness to the lateral aspect of the left heel.  History of left ankle/leg ORIF several years prior.   Past Medical History:  Diagnosis Date   Chronic meniscal tear of knee 08-23-12   left knee meniscal tear   GERD (gastroesophageal reflux disease) 08-23-12   reflux controlled with Dexilant    Headache(784.0) 08-23-12   tx. migraines -with Botox inj. x3 months   Migraine    PTSD (post-traumatic stress disorder)    URI (upper respiratory infection)    01/30/13- no fever- head congestion with greenish drainage- none now   Past Surgical History:  Procedure Laterality Date   BLEPHAROPLASTY  08-23-12   bilateral   CHOLECYSTECTOMY  08-23-12   lap. Cholecystectomy(stones)   FRACTURE SURGERY  08-23-12   '00-ORIF left leg fracture-retained rod   HARDWARE REMOVAL Left 02/13/2013   Procedure: REMOVAL OF TIBIA NAIL ;  Surgeon: Dempsey LULLA Moan, MD;  Location: [facility];  Service: Orthopedics;  Laterality: Left;  REMOVAL OF TIBIA NAIL    KNEE ARTHROSCOPY  08/24/2012   Procedure: ARTHROSCOPY KNEE;  Surgeon: Dempsey LULLA Moan, MD;  Location: [facility];  Service: Orthopedics;  Laterality: Left;  lateral meniscal debridement   LASIK  08-23-12   bilateral   TOTAL KNEE ARTHROPLASTY Left 02/13/2013   Procedure: TOTAL KNEE ARTHROPLASTY;  Surgeon: Dempsey LULLA Moan, MD;  Location: [facility];  Service: Orthopedics;  Laterality: Left;   Allergies  Allergen Reactions   Hydrocodone-Acetaminophen       Objective: Physical Exam General: The patient is alert and oriented x3 in no acute  distress.  Dermatology: Skin is warm, dry and supple bilateral lower extremities. Negative for open lesions or macerations bilateral.   Vascular: Dorsalis Pedis and Posterior Tibial pulses palpable bilateral.  Capillary fill time is immediate to all digits.  Neurological: Grossly intact via light touch.   Musculoskeletal: Tenderness to palpation to the plantar aspect of the lateral aspect of the left heel along the plantar fascia. All other joints range of motion within normal limits bilateral. Strength 5/5 in all groups bilateral.  High arches noted with cavus foot type left lower extremity  Radiographic exam LT foot 10/16/2024: High arches noted with increased calcaneal inclination angle.  Orthopedic hardware noted to the fibula and intramedullary rod to the tibia without any complicating features.  Assessment: 1. Plantar fasciitis left foot; lateral aspect 2.  High arches bilateral 3.  PSxHx ORIF LT ankle ~ 2000  Plan of Care:  -Patient evaluated.  -Injection of 0.5cc Celestone  soluspan injected into the left plantar fascia directly onto the bottom of the heel.  -Continue meloxicam  15 mg daily as prescribed -Continue to refrain from barefoot. Currently wearing [employer]oka tennis shoes -OTC powerstep insoles dispensed today to support the arch of the foot. Wear daily -Return to clinic 4 weeks  *Husband 'Rob' is PCP for [facility]   Thresa EMERSON Sar, DPM [facility]  Dr. Thresa EMERSON Sar, DPM    [address]  [place], [place] [address]                Office [phone]  Fax [phone]

## 2024-11-28 ENCOUNTER — Other Ambulatory Visit (HOSPITAL_COMMUNITY): Payer: Self-pay

## 2024-12-10 ENCOUNTER — Other Ambulatory Visit (HOSPITAL_COMMUNITY): Payer: Self-pay

## 2024-12-14 ENCOUNTER — Other Ambulatory Visit (HOSPITAL_COMMUNITY): Payer: Self-pay

## 2024-12-14 MED ORDER — MELOXICAM 15 MG PO TABS
15.0000 mg | ORAL_TABLET | Freq: Every day | ORAL | 1 refills | Status: AC | PRN
  Filled 2024-12-14: qty 100, 100d supply, fill #0

## 2024-12-15 ENCOUNTER — Other Ambulatory Visit: Payer: Self-pay

## 2024-12-19 ENCOUNTER — Other Ambulatory Visit (HOSPITAL_COMMUNITY): Payer: Self-pay

## 2024-12-20 ENCOUNTER — Other Ambulatory Visit: Payer: Self-pay

## 2024-12-20 ENCOUNTER — Other Ambulatory Visit (HOSPITAL_COMMUNITY): Payer: Self-pay

## 2024-12-20 MED ORDER — SOLIFENACIN SUCCINATE 5 MG PO TABS
ORAL_TABLET | ORAL | 1 refills | Status: AC
  Filled 2024-12-20: qty 100, 100d supply, fill #0

## 2024-12-25 ENCOUNTER — Other Ambulatory Visit (HOSPITAL_COMMUNITY): Payer: Self-pay

## 2025-01-01 ENCOUNTER — Ambulatory Visit: Admitting: Podiatry

## 2025-01-01 DIAGNOSIS — M722 Plantar fascial fibromatosis: Secondary | ICD-10-CM | POA: Diagnosis not present

## 2025-01-01 MED ORDER — BETAMETHASONE SOD PHOS & ACET 6 (3-3) MG/ML IJ SUSP
3.0000 mg | Freq: Once | INTRAMUSCULAR | Status: AC
  Administered 2025-01-01: 3 mg via INTRA_ARTICULAR

## 2025-01-01 NOTE — Progress Notes (Signed)
 "  Chief Complaint  Patient presents with   Plantar Fasciitis    RM 9 Patient is here to f/u on left foot plantar fasciitis. Pt would like an injection today, pain has returned.    Subjective: 74 y.o. female presenting today for follow-up evaluation of plantar fasciitis left heel  Brief history: Onset October 2025.  She began wearing minimalist barefoot shoes on a trip to [place] and when she returned she began to experience significant pain and tenderness to the lateral aspect of the left heel.  History of left ankle/leg ORIF several years prior.   Past Medical History:  Diagnosis Date   Chronic meniscal tear of knee 08-23-12   left knee meniscal tear   GERD (gastroesophageal reflux disease) 08-23-12   reflux controlled with Dexilant    Headache(784.0) 08-23-12   tx. migraines -with Botox inj. x3 months   Migraine    PTSD (post-traumatic stress disorder)    URI (upper respiratory infection)    01/30/13- no fever- head congestion with greenish drainage- none now   Past Surgical History:  Procedure Laterality Date   BLEPHAROPLASTY  08-23-12   bilateral   CHOLECYSTECTOMY  08-23-12   lap. Cholecystectomy(stones)   FRACTURE SURGERY  08-23-12   '00-ORIF left leg fracture-retained rod   HARDWARE REMOVAL Left 02/13/2013   Procedure: REMOVAL OF TIBIA NAIL ;  Surgeon: Dempsey LULLA Moan, MD;  Location: [facility];  Service: Orthopedics;  Laterality: Left;  REMOVAL OF TIBIA NAIL    KNEE ARTHROSCOPY  08/24/2012   Procedure: ARTHROSCOPY KNEE;  Surgeon: Dempsey LULLA Moan, MD;  Location: [facility];  Service: Orthopedics;  Laterality: Left;  lateral meniscal debridement   LASIK  08-23-12   bilateral   TOTAL KNEE ARTHROPLASTY Left 02/13/2013   Procedure: TOTAL KNEE ARTHROPLASTY;  Surgeon: Dempsey LULLA Moan, MD;  Location: [facility];  Service: Orthopedics;  Laterality: Left;   Allergies  Allergen Reactions   Hydrocodone-Acetaminophen       Objective: Physical Exam General: The patient is alert and oriented x3 in no acute  distress.  Dermatology: Skin is warm, dry and supple bilateral lower extremities. Negative for open lesions or macerations bilateral.   Vascular: Dorsalis Pedis and Posterior Tibial pulses palpable bilateral.  Capillary fill time is immediate to all digits.  Neurological: Grossly intact via light touch.   Musculoskeletal: There continues to be tenderness to palpation to the plantar aspect of the lateral aspect of the left heel along the plantar fascia. All other joints range of motion within normal limits bilateral. Strength 5/5 in all groups bilateral.  High arches noted with cavus foot type left lower extremity  Radiographic exam LT foot 10/16/2024: High arches noted with increased calcaneal inclination angle.  Orthopedic hardware noted to the fibula and intramedullary rod to the tibia without any complicating features.  Assessment: 1. Plantar fasciitis left foot; lateral aspect 2.  High arches bilateral 3.  PSxHx ORIF LT ankle ~ 2000  Plan of Care:  -Patient evaluated.  -Injection of 0.5cc Celestone  soluspan injected into the left plantar fascia lateral aspect today -Continue meloxicam  15 mg daily as prescribed -Continue to refrain from barefoot. Currently wearing hoka tennis shoes - Continue OTC powerstep insoles  -Today we discussed additional modalities including EPAT/shockwave, physical therapy, PRP injections, and ultimately surgery.  For now we will continue to pursue conservative management -Night splint dispensed.  Wear nightly -Return to clinic 4 weeks  *Husband 'Rob' is PCP for [facility]. Retired. Leaving for a cruise in 1 week  Thresa EMERSON Sar, DPM [facility]  Dr. Thresa EMERSON Sar, DPM    [address] [place], [place] [address]                Office [phone]  Fax [phone]     "

## 2025-01-02 ENCOUNTER — Other Ambulatory Visit: Payer: Self-pay | Admitting: Family Medicine

## 2025-01-02 DIAGNOSIS — Z1231 Encounter for screening mammogram for malignant neoplasm of breast: Secondary | ICD-10-CM

## 2025-01-05 ENCOUNTER — Other Ambulatory Visit (HOSPITAL_COMMUNITY): Payer: Self-pay

## 2025-01-05 MED ORDER — OSELTAMIVIR PHOSPHATE 75 MG PO CAPS
75.0000 mg | ORAL_CAPSULE | Freq: Every day | ORAL | 0 refills | Status: AC
  Filled 2025-01-05 (×2): qty 10, 10d supply, fill #0

## 2025-01-17 ENCOUNTER — Other Ambulatory Visit (HOSPITAL_COMMUNITY): Payer: Self-pay

## 2025-01-17 MED ORDER — AMOXICILLIN 875 MG PO TABS
875.0000 mg | ORAL_TABLET | Freq: Two times a day (BID) | ORAL | 0 refills | Status: AC
  Filled 2025-01-17: qty 14, 7d supply, fill #0

## 2025-02-06 ENCOUNTER — Ambulatory Visit: Payer: Self-pay
